# Patient Record
Sex: Male | Born: 1975 | Race: Black or African American | Hispanic: No | Marital: Single | Smoking: Current every day smoker
Health system: Southern US, Community
[De-identification: ages and names within clinical notes are randomized; demographics above are authoritative.]

## PROBLEM LIST (undated history)

## (undated) DIAGNOSIS — J45909 Unspecified asthma, uncomplicated: Secondary | ICD-10-CM

## (undated) DIAGNOSIS — R569 Unspecified convulsions: Secondary | ICD-10-CM

---

## 2011-08-07 ENCOUNTER — Encounter (HOSPITAL_COMMUNITY): Payer: Self-pay | Admitting: *Deleted

## 2011-08-07 ENCOUNTER — Emergency Department (HOSPITAL_COMMUNITY)
Admission: EM | Admit: 2011-08-07 | Discharge: 2011-08-07 | Disposition: A | Discharge status: Home / Self Care | Payer: Self-pay | Attending: Emergency Medicine | Admitting: Emergency Medicine

## 2011-08-07 DIAGNOSIS — R1110 Vomiting, unspecified: Secondary | ICD-10-CM | POA: Insufficient documentation

## 2011-08-07 DIAGNOSIS — R404 Transient alteration of awareness: Secondary | ICD-10-CM | POA: Insufficient documentation

## 2011-08-07 DIAGNOSIS — R569 Unspecified convulsions: Secondary | ICD-10-CM | POA: Insufficient documentation

## 2011-08-07 DIAGNOSIS — Z79899 Other long term (current) drug therapy: Secondary | ICD-10-CM | POA: Insufficient documentation

## 2011-08-07 HISTORY — DX: Unspecified convulsions: R56.9

## 2011-08-07 LAB — PHENOBARBITAL LEVEL: Phenobarbital: <2.4 ug/mL — ABNORMAL LOW (ref 15.0–40.0)

## 2011-08-07 MED ORDER — PHENOBARBITAL 64.8 MG PO TABS
64.8000 mg | ORAL_TABLET | ORAL | Status: AC
  Administered 2011-08-07: 64.8 mg via ORAL
  Filled 2011-08-07: qty 1

## 2011-08-07 MED ORDER — PHENYTOIN SODIUM EXTENDED 100 MG PO CAPS
300.0000 mg | ORAL_CAPSULE | Freq: Once | ORAL | Status: AC
  Administered 2011-08-07: 300 mg via ORAL
  Filled 2011-08-07: qty 3

## 2011-08-07 MED ORDER — LORAZEPAM 2 MG/ML IJ SOLN
INTRAMUSCULAR | Status: AC
  Administered 2011-08-07: 2 mg via INTRAVENOUS
  Filled 2011-08-07: qty 1

## 2011-08-07 MED ORDER — PHENYTOIN SODIUM EXTENDED 100 MG PO CAPS: 100.0000 mg | ORAL_CAPSULE | Freq: Three times a day (TID) | ORAL | Status: DC

## 2011-08-07 MED ORDER — PHENOBARBITAL 60 MG PO TABS: 60.0000 mg | ORAL_TABLET | Freq: Two times a day (BID) | ORAL | Status: DC

## 2011-08-07 NOTE — ED Notes (Addendum)
Pt reports having a petite seizure at home tonight.  Hx of seizures-not had medication in over a month.  States that he always has a grandmal seizure following a petite seizure.  Reports that he has a HA.  No bite marks on tongue, no urinary incontinence.  Pt A/O x 4.  No confusion or post-ictal noted.

## 2011-08-07 NOTE — ED Notes (Signed)
Rx x 2, pt voiced understanding to f/u with PCP and return for worsening sx.

## 2011-08-07 NOTE — ED Notes (Signed)
Pt and family requesting to see MD.  Becoming agitated, assured MD will see pt soon.

## 2011-08-07 NOTE — Discharge Instructions (Signed)
Nonepileptic Seizures  Nonepileptic seizures look like true epileptic seizures. The difference between nonepileptic seizures and real seizures is that real seizures are caused by an electrical abnormality in the brain. Nonepileptic seizures have no medical cause. Nonepileptic seizures may look real to an untrained person. A neurologist can usually tell the difference between a real seizure and a nonepileptic seizure. Nonepileptic seizures may also be called pseudoseizures. They are more frequent in women.  CAUSES   In general, the patient is unaware that the movements are not real seizures. This disorder is caused by stress or emotional trauma. Patients often feel badly. Patients are sometimes accused of causing the seizure-like movements when they are not aware that their symptoms are due to stress. The nonepileptic seizures are real and frightening to patients with this disorder. Sometimes, nonepileptic seizures may be due to a person faking the symptoms to get something he or she wants.  DIAGNOSIS   The diagnosis requires the patient to be continuously monitored by:   EEG (electroencephalogram).   Video camera.  After an episode, the patient is asked about their awareness, memory, and feelings during the seizure. The family, if present, also discusses what they see. The EEG and clinical information allows the neurologist to determine if the seizures are related to abnormal electrical activity in the brain.  TREATMENT   Medicines may be stopped if the patient has been treated for a true seizure disorder. Patient counseling is usually begun. Depression and anxiety, if present, are treated. Counseling helps to resolve stress.   Document Released: 06/04/2005 Document Revised: 04/08/2011 Document Reviewed: 10/31/2008  [employer] Patient Information 2012 [employer].

## 2011-08-07 NOTE — ED Provider Notes (Addendum)
History     CSN: [id number]  Arrival date & time 08/07/11  4098   First MD Initiated Contact with Patient 08/07/11 0440      Chief Complaint  Patient presents with  . Seizures    (Consider location/radiation/quality/duration/timing/severity/associated sxs/prior treatment) HPI This is a 36 year old black male with a history of seizures. He admits to not taking his phenobarbital and phenytoin in over a month. He had what he describes as a "small seizure" just prior to arrival. This is characterized as fluttering of his eyelashes and a blank stare. He had a single episode of emesis following this and return to his normal state. On arrival in triaged he had a generalized tonic-clonic seizure which was witnessed by nursing staff. He denies biting his tongue. He denies urinary incontinence. He states he frequently has a grand mal seizure following a petite seizure. He was given 2 mg of Ativan after seizure to prevent recurrence.  Past Medical History  Diagnosis Date  . Seizures     History reviewed. No pertinent past surgical history.  History reviewed. No pertinent family history.  History  Substance Use Topics  . Smoking status: Never Smoker   . Smokeless tobacco: Not on file  . Alcohol Use: No      Review of Systems  All other systems reviewed and are negative.    Allergies  Review of patient's allergies indicates no known allergies.  Home Medications   Current Outpatient Rx  Name Route Sig Dispense Refill  . IBUPROFEN 200 MG PO TABS Oral Take 200-400 mg by mouth every 6 (six) hours as needed. For headache or pain    . MIRTAZAPINE 7.5 MG PO TABS Oral Take 7.5 mg by mouth at bedtime.    Marland Kitchen PHENOBARBITAL 60 MG PO TABS Oral Take 60 mg by mouth at bedtime.    Marland Kitchen PHENYTOIN SODIUM EXTENDED 100 MG PO CAPS Oral Take 100 mg by mouth 3 (three) times daily.      BP 109/66  Pulse 69  Temp(Src) 98.8 F (37.1 C) (Oral)  Resp 14  SpO2 96%  Physical Exam General:  Well-developed, well-nourished male in no acute distress; appearance consistent with age of record HENT: normocephalic, atraumatic; no bite marks to tongue Eyes: pupils equal round and reactive to light; extraocular muscles intact Neck: supple Heart: regular rate and rhythm Lungs: clear to auscultation bilaterally Abdomen: soft; nondistended; nontender; no masses or hepatosplenomegaly; bowel sounds present Extremities: No deformity; full range of motion Neurologic: Awake but drowsy; motor function intact in all extremities and symmetric; no facial droop Skin: Warm and dry Psychiatric: Normal mood and affect    ED Course  Procedures (including critical care time)     MDM   Date: 08/07/2011 0217  Rate: 68  Rhythm: normal sinus rhythm  QRS Axis: normal  Intervals: normal  ST/T Wave abnormalities: normal  Conduction Disutrbances: none  Narrative Interpretation: unremarkable  Comparison with previous EKG: None available  Nursing notes and vitals signs, including pulse oximetry, reviewed.  Summary of this visit's results, reviewed by myself:  Labs:  Results for orders placed during the hospital encounter of 08/07/11  PHENOBARBITAL LEVEL      Component Value Range   Phenobarbital <2.4 (*) 15.0 - 40.0 (ug/mL)  PHENYTOIN LEVEL, TOTAL      Component Value Range   Phenytoin Lvl <2.5 (*) 10.0 - 20.0 (ug/mL)            Hanley Seamen, MD 08/07/11 0449  Xzaviar Maloof L Mackenzie Groom,  MD 08/07/11 4098  Hanley Seamen, MD 08/07/11 1191

## 2011-08-07 NOTE — ED Notes (Signed)
Pt had a witness seizure in triage lasting approx 45 seconds.  3 RNs, 2 NT with pt.  When seizure was finished, pt was lifted onto a wheelchair and taken to room 4.  Pt did have blood coming out of his mouth during the seizure.  No urinary incontinence.  Pt post-ictal after seizure.

## 2017-01-21 ENCOUNTER — Encounter: Payer: Self-pay | Admitting: *Deleted

## 2017-01-21 ENCOUNTER — Emergency Department
Admission: EM | Admit: 2017-01-21 | Discharge: 2017-01-21 | Disposition: A | Discharge status: Home / Self Care | Payer: Medicaid Other | Attending: Emergency Medicine | Admitting: Emergency Medicine

## 2017-01-21 DIAGNOSIS — R197 Diarrhea, unspecified: Secondary | ICD-10-CM | POA: Diagnosis not present

## 2017-01-21 DIAGNOSIS — R109 Unspecified abdominal pain: Secondary | ICD-10-CM | POA: Diagnosis present

## 2017-01-21 DIAGNOSIS — R1084 Generalized abdominal pain: Secondary | ICD-10-CM | POA: Diagnosis not present

## 2017-01-21 DIAGNOSIS — Z79899 Other long term (current) drug therapy: Secondary | ICD-10-CM | POA: Diagnosis not present

## 2017-01-21 DIAGNOSIS — F17210 Nicotine dependence, cigarettes, uncomplicated: Secondary | ICD-10-CM | POA: Insufficient documentation

## 2017-01-21 LAB — CBC
HEMATOCRIT: 39 % — AB (ref 40.0–52.0)
Hemoglobin: 13.3 g/dL (ref 13.0–18.0)
MCH: 31.6 pg (ref 26.0–34.0)
MCHC: 34.1 g/dL (ref 32.0–36.0)
MCV: 92.6 fL (ref 80.0–100.0)
Platelets: 227 10*3/uL (ref 150–440)
RBC: 4.21 MIL/uL — ABNORMAL LOW (ref 4.40–5.90)
RDW: 14.1 % (ref 11.5–14.5)
WBC: 3.5 10*3/uL — ABNORMAL LOW (ref 3.8–10.6)

## 2017-01-21 LAB — URINALYSIS, COMPLETE (UACMP) WITH MICROSCOPIC
BACTERIA UA: NONE SEEN
Bilirubin Urine: NEGATIVE
Glucose, UA: NEGATIVE mg/dL
HGB URINE DIPSTICK: NEGATIVE
Ketones, ur: 5 mg/dL — AB
Leukocytes, UA: NEGATIVE
NITRITE: NEGATIVE
Protein, ur: NEGATIVE mg/dL
SPECIFIC GRAVITY, URINE: 1.036 — AB (ref 1.005–1.030)
pH: 5 (ref 5.0–8.0)

## 2017-01-21 LAB — COMPREHENSIVE METABOLIC PANEL
ALBUMIN: 4 g/dL (ref 3.5–5.0)
ALK PHOS: 57 U/L (ref 38–126)
ALT: 35 U/L (ref 17–63)
AST: 36 U/L (ref 15–41)
Anion gap: 6 (ref 5–15)
BUN: 19 mg/dL (ref 6–20)
CALCIUM: 8.8 mg/dL — AB (ref 8.9–10.3)
CO2: 25 mmol/L (ref 22–32)
Chloride: 106 mmol/L (ref 101–111)
Creatinine, Ser: 0.94 mg/dL (ref 0.61–1.24)
GFR calc Af Amer: >60 mL/min (ref >60)
GFR calc non Af Amer: >60 mL/min (ref >60)
GLUCOSE: 85 mg/dL (ref 65–99)
POTASSIUM: 4 mmol/L (ref 3.5–5.1)
Sodium: 137 mmol/L (ref 135–145)
TOTAL PROTEIN: 7.4 g/dL (ref 6.5–8.1)
Total Bilirubin: 0.6 mg/dL (ref 0.3–1.2)

## 2017-01-21 LAB — LIPASE, BLOOD: Lipase: 24 U/L (ref 11–51)

## 2017-01-21 MED ORDER — DICYCLOMINE HCL 20 MG PO TABS: 20.0000 mg | ORAL_TABLET | Freq: Three times a day (TID) | ORAL | 0 refills | Status: DC | PRN

## 2017-01-21 NOTE — ED Provider Notes (Signed)
[facility] Emergency Department Provider Note  ____________________________________________   I have reviewed the triage vital signs and the nursing notes.   HISTORY  Chief Complaint Abdominal Pain   History limited by: Not Limited   HPI Larry Lucas is a 41 y.o. male who presents to the emergency department today because of concerns for abdominal pain. Is located in the lower abdomen. He describes as being located on both sides. He describes it as sharp. It does radiate towards the center of his abdomen. The pain started today. It has gotten somewhat better since it started. He did have an episode of diarrhea. He states that when he wiped he noticed a small amount of blood on the tissue. He denies any nausea or vomiting. Denies any unusual ingestion. Denies history of intestinal disorder.   Past Medical History:  Diagnosis Date  . Seizures (HCC)     There are no active problems to display for this patient.   History reviewed. No pertinent surgical history.  Prior to Admission medications   Medication Sig Start Date End Date Taking? Authorizing Provider  ibuprofen (ADVIL,MOTRIN) 200 MG tablet Take 200-400 mg by mouth every 6 (six) hours as needed. For headache or pain    [provider]  mirtazapine (REMERON) 7.5 MG tablet Take 7.5 mg by mouth at bedtime.    [provider]  PHENObarbital (LUMINAL) 60 MG tablet Take 60 mg by mouth at bedtime.    [provider]  PHENObarbital (LUMINAL) 60 MG tablet Take 1 tablet (60 mg total) by mouth 2 (two) times daily. 08/07/11 09/06/11  Molpus, John, MD  phenytoin (DILANTIN) 100 MG ER capsule Take 100 mg by mouth 3 (three) times daily.    [provider]  phenytoin (DILANTIN) 100 MG ER capsule Take 1 capsule (100 mg total) by mouth 3 (three) times daily. 08/07/11 08/06/12  Molpus, Jonny Ruiz, MD  phenytoin (DILANTIN) 100 MG ER capsule Take 1 capsule (100 mg total) by mouth 3 (three) times  daily. 08/07/11 08/06/12  Molpus, John, MD    Allergies Penicillins  No family history on file.  Social History Social History  Substance Use Topics  . Smoking status: Current Every Day Smoker    Packs/day: 0.50    Types: Cigarettes  . Smokeless tobacco: Not on file  . Alcohol use No    Review of Systems Constitutional: No fever/chills Eyes: No visual changes. ENT: No sore throat. Cardiovascular: Denies chest pain. Respiratory: Denies shortness of breath. Gastrointestinal: Positive for abdominal pain. Positive for diarrhea.  Genitourinary: Negative for dysuria. Musculoskeletal: Negative for back pain. Skin: Negative for rash. Neurological: Negative for headaches, focal weakness or numbness.  ____________________________________________   PHYSICAL EXAM:  VITAL SIGNS: ED Triage Vitals [01/21/17 1014]  Enc Vitals Group     BP 130/63     Pulse Rate 66     Resp 20     Temp 98.7 F (37.1 C)     Temp Source Oral     SpO2 100 %     Weight 215 lb (97.5 kg)     Height  (1.854 m)     Head Circumference      Peak Flow      Pain Score 8   Constitutional: Alert and oriented. Well appearing and in no distress. Eyes: Conjunctivae are normal.  ENT   Head: Normocephalic and atraumatic.   Nose: No congestion/rhinnorhea.   Mouth/Throat: Mucous membranes are moist.   Neck: No stridor. Hematological/Lymphatic/Immunilogical: No  cervical lymphadenopathy. Cardiovascular: Normal rate, regular rhythm.  No murmurs, rubs, or gallops.  Respiratory: Normal respiratory effort without tachypnea nor retractions. Breath sounds are clear and equal bilaterally. No wheezes/rales/rhonchi. Gastrointestinal: Soft and non tender. No rebound. No guarding.  Genitourinary: Deferred Musculoskeletal: Normal range of motion in all extremities. No lower extremity edema. Neurologic:  Normal speech and language. No gross focal neurologic deficits are appreciated.  Skin:  Skin is warm, dry  and intact. No rash noted. Psychiatric: Mood and affect are normal. Speech and behavior are normal. Patient exhibits appropriate insight and judgment.  ____________________________________________    LABS (pertinent positives/negatives)  CMP wnl save for ca 8.8 CBC WBC 3.5 Hgb 13.3 Pl 227 UA RBC 6-30 ____________________________________________   EKG  None  ____________________________________________    RADIOLOGY  None  ____________________________________________   PROCEDURES  Procedures  ____________________________________________   INITIAL IMPRESSION / ASSESSMENT AND PLAN / ED COURSE  Pertinent labs & imaging results that were available during my care of the patient were reviewed by me and considered in my medical decision making (see chart for details).  Patient presented with abdominal pain and diarrhea. Differential includes food poisoning, gastroenteritis, diverticulitis, crohns. Given negative blood work and benign exam think food poisoning/gastroenteritis most likely. Will plan on discharging home. Will give prescription for bentyl.   ____________________________________________   FINAL CLINICAL IMPRESSION(S) / ED DIAGNOSES  Final diagnoses:  Generalized abdominal pain  Diarrhea, unspecified type     Note: This dictation was prepared with Dragon dictation. Any transcriptional errors that result from this process are unintentional     Phineas Semen, MD 01/21/17 1354

## 2017-01-21 NOTE — ED Triage Notes (Signed)
Pt complains of lower abdominal pain below the navel starting today, pt reports one episode of rectal bleeding with bowel movement today

## 2017-01-21 NOTE — Discharge Instructions (Signed)
Please seek medical attention for any high fevers, chest pain, shortness of breath, change in behavior, persistent vomiting, bloody stool or any other new or concerning symptoms.  

## 2017-01-21 NOTE — ED Notes (Signed)
Called pt to go back to a room, pt is in the cafeteria

## 2017-01-21 NOTE — ED Notes (Signed)
Pt said he would be outside about 1055.  We could see him walking up to end of parking lot and went to his car.  He is still in his car.

## 2017-08-13 ENCOUNTER — Emergency Department: Payer: No Typology Code available for payment source

## 2017-08-13 ENCOUNTER — Other Ambulatory Visit: Payer: Self-pay

## 2017-08-13 ENCOUNTER — Emergency Department
Admission: EM | Admit: 2017-08-13 | Discharge: 2017-08-14 | Disposition: A | Discharge status: Home / Self Care | Payer: No Typology Code available for payment source | Attending: Emergency Medicine | Admitting: Emergency Medicine

## 2017-08-13 DIAGNOSIS — Y9389 Activity, other specified: Secondary | ICD-10-CM | POA: Diagnosis not present

## 2017-08-13 DIAGNOSIS — S4982XA Other specified injuries of left shoulder and upper arm, initial encounter: Secondary | ICD-10-CM | POA: Diagnosis present

## 2017-08-13 DIAGNOSIS — S40012A Contusion of left shoulder, initial encounter: Secondary | ICD-10-CM | POA: Insufficient documentation

## 2017-08-13 DIAGNOSIS — S46002A Unspecified injury of muscle(s) and tendon(s) of the rotator cuff of left shoulder, initial encounter: Secondary | ICD-10-CM | POA: Diagnosis not present

## 2017-08-13 DIAGNOSIS — W01198A Fall on same level from slipping, tripping and stumbling with subsequent striking against other object, initial encounter: Secondary | ICD-10-CM | POA: Insufficient documentation

## 2017-08-13 DIAGNOSIS — Y929 Unspecified place or not applicable: Secondary | ICD-10-CM | POA: Insufficient documentation

## 2017-08-13 DIAGNOSIS — Y990 Civilian activity done for income or pay: Secondary | ICD-10-CM | POA: Diagnosis not present

## 2017-08-13 DIAGNOSIS — F17210 Nicotine dependence, cigarettes, uncomplicated: Secondary | ICD-10-CM | POA: Diagnosis not present

## 2017-08-13 DIAGNOSIS — T148XXA Other injury of unspecified body region, initial encounter: Secondary | ICD-10-CM

## 2017-08-13 DIAGNOSIS — Z79899 Other long term (current) drug therapy: Secondary | ICD-10-CM | POA: Insufficient documentation

## 2017-08-13 IMAGING — CR DG SHOULDER 2+V*L*
1 series · 3 of 3 positions shown · non-contrast
Comparison: None.

CLINICAL DATA: Fall, left shoulder pain

EXAM:
LEFT SHOULDER - 2+ VIEW

[Series 1: dg shoulder left · 0.14mm/px · 3 of 3 slices shown]
[im 1/3]
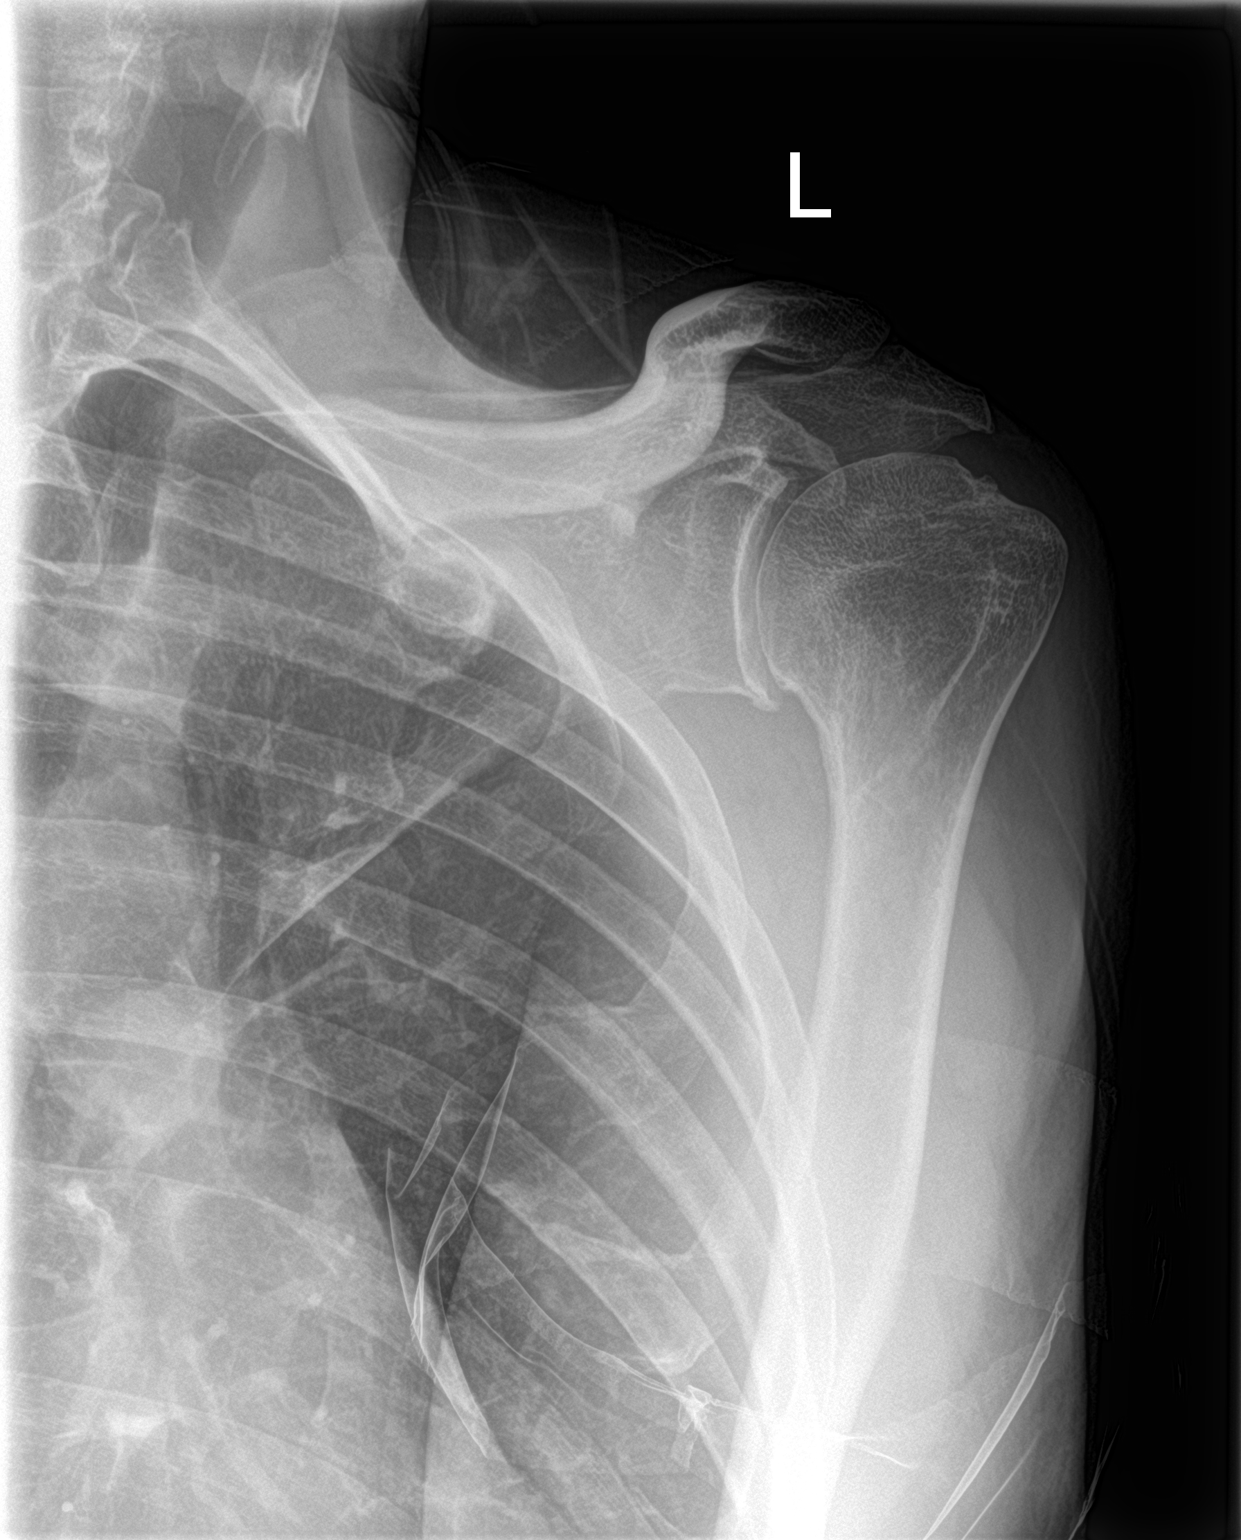
[im 2/3]
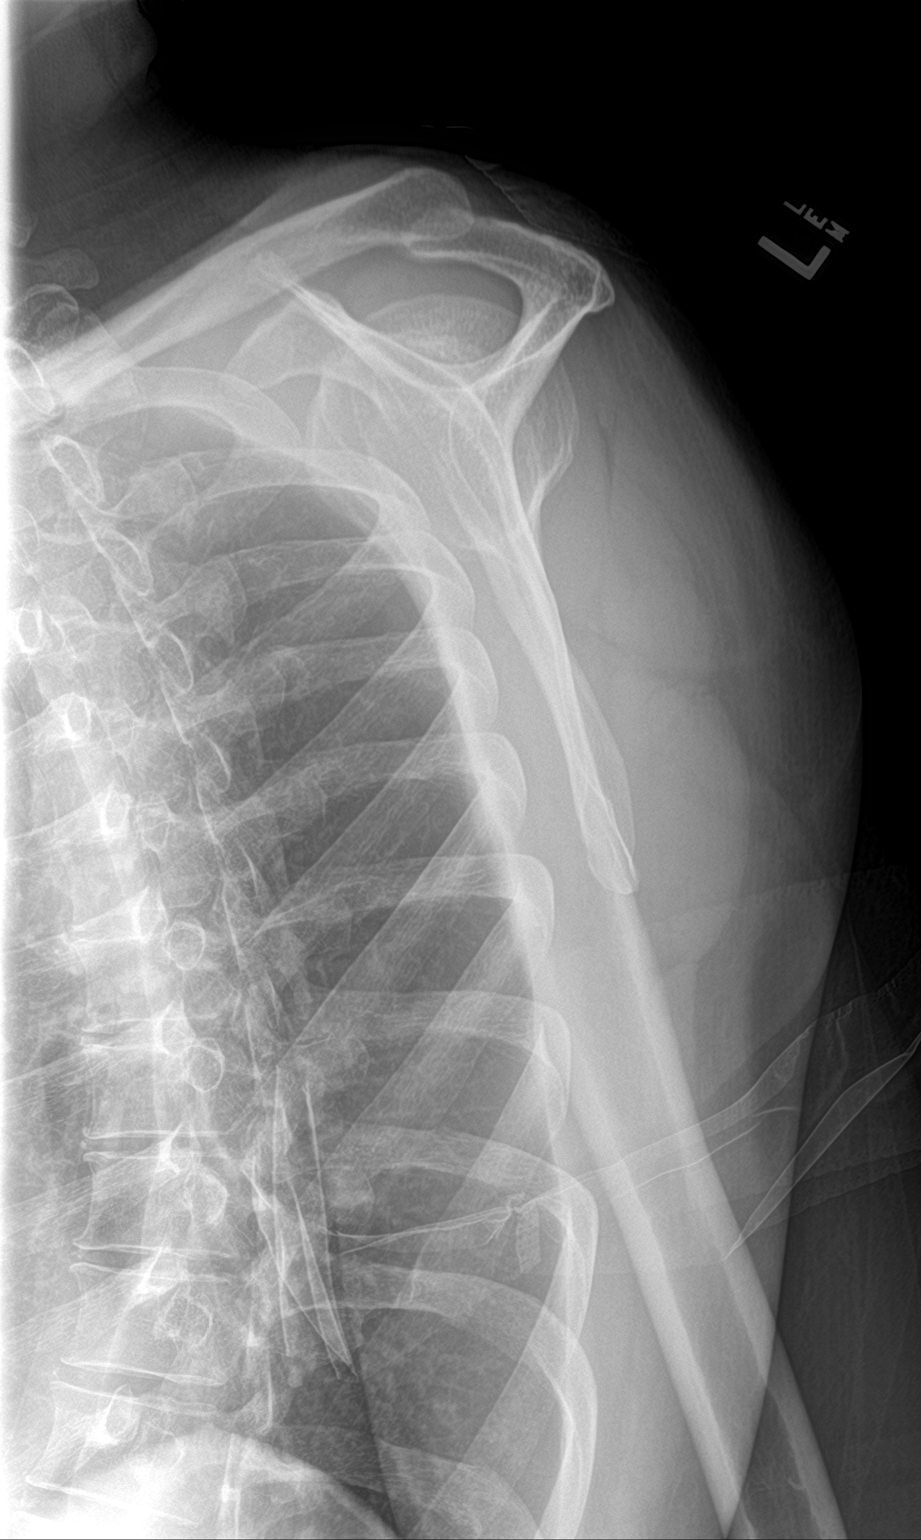
[im 3/3]
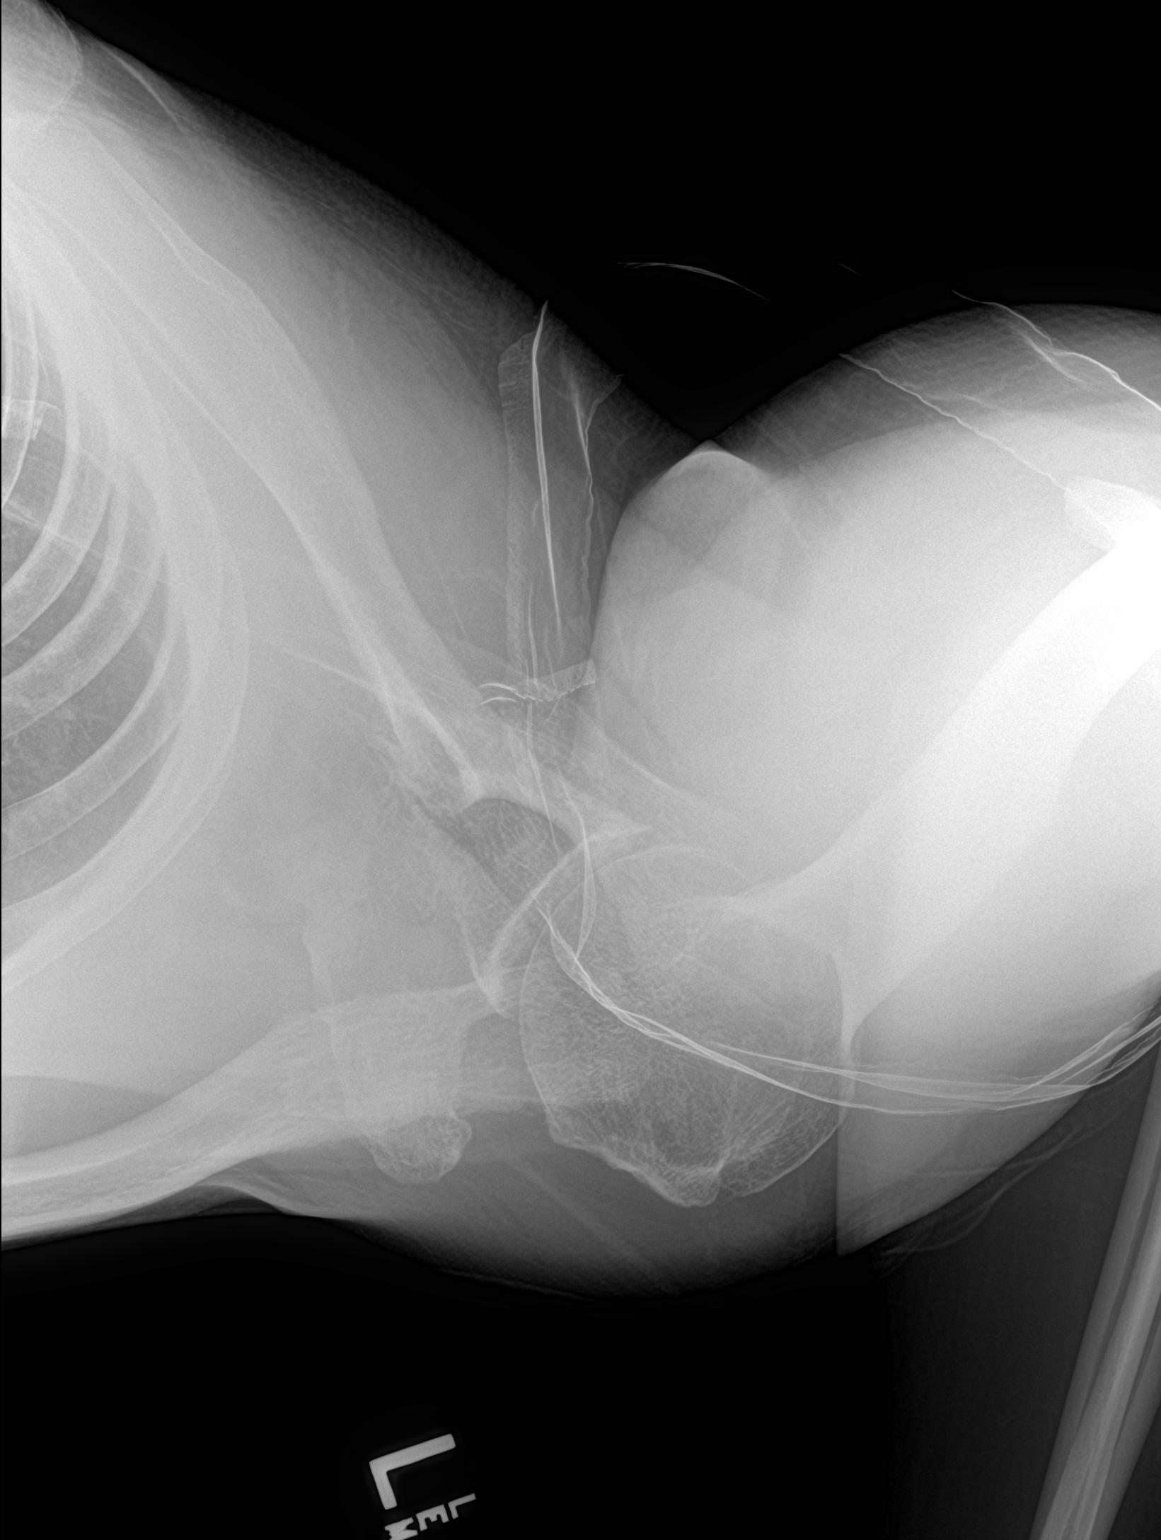

[3 of 3 positions shown; findings below may reference images not displayed]

FINDINGS: No fracture or dislocation is seen.

Mild degenerative changes of the glenohumeral joint.

The visualized soft tissues are unremarkable.

Visualized left lung is clear.
IMPRESSION: No fracture or dislocation is seen.

## 2017-08-13 NOTE — ED Notes (Addendum)
Unable to speak with someone regarding w/c requirements. No answer by contact person Serita KyleEdwina Collins on w/c  profile at either number provided [phone]) or [phone]).

## 2017-08-13 NOTE — ED Triage Notes (Signed)
Pt arrives to ED via POV from work with c/o slip and fall injury. Pt reports slipping in some detergent on the floor at work and injured his left shoulder. No reports of head injury or LOC. No obvious deformity or dislocation observed; CMS intact. Pt reports injury will be a Worker's Comp case; profile is being looked up at this time for UDS/BAL requirements.

## 2017-08-13 NOTE — ED Notes (Signed)
First nurse note: pt states he slipped in liquid at his job, falling injuring left shoulder.

## 2017-08-14 MED ORDER — CARISOPRODOL 350 MG PO TABS: 350.0000 mg | ORAL_TABLET | Freq: Three times a day (TID) | ORAL | 0 refills | Status: DC | PRN

## 2017-08-14 MED ORDER — IBUPROFEN 600 MG PO TABS
600.0000 mg | ORAL_TABLET | Freq: Once | ORAL | Status: AC
  Administered 2017-08-14: 600 mg via ORAL
  Filled 2017-08-14: qty 1

## 2017-08-14 NOTE — ED Provider Notes (Signed)
[facility] Emergency Department Provider Note  ___________________________________________   First MD Initiated Contact with Patient 08/13/17 2352     (approximate)  I have reviewed the triage vital signs and the nursing notes.   HISTORY  Chief Complaint Fall  HPI Larry Lucas is a 42 y.o. male with a history of seizure disorder who is presenting after a fall at his place of work this evening.  He states that he slipped on some detergent on the floor and fell, hitting his left collarbone into a metal shelf.  He says that he tensed up during the fall and now he is having pain to his left shoulder blade as well.  He has pain as well as a left collarbone, shoulder as well as left thoracic back when he moves his left arm at this time.  Is not reporting any loss of consciousness or hitting his head.  He is here on a Workmen's Comp. claim.  Says that he also requires heavy lifting in his job up to 50 pounds at a time.   Past Medical History:  Diagnosis Date  . Seizures (HCC)     There are no active problems to display for this patient.   History reviewed. No pertinent surgical history.  Prior to Admission medications   Medication Sig Start Date End Date Taking? Authorizing Provider  dicyclomine (BENTYL) 20 MG tablet Take 1 tablet (20 mg total) by mouth 3 (three) times daily as needed (abdominal pain). 01/21/17   Phineas Semen, MD  PHENObarbital (LUMINAL) 60 MG tablet Take 1 tablet (60 mg total) by mouth 2 (two) times daily. 08/07/11 09/06/11  Molpus, Jonny Ruiz, MD  phenytoin (DILANTIN) 100 MG ER capsule Take 1 capsule (100 mg total) by mouth 3 (three) times daily. 08/07/11 08/06/12  Molpus, Jonny Ruiz, MD  phenytoin (DILANTIN) 100 MG ER capsule Take 1 capsule (100 mg total) by mouth 3 (three) times daily. 08/07/11 08/06/12  Molpus, John, MD    Allergies Penicillins  No family history on file.  Social History Social History   Tobacco Use  . Smoking status: Current  Every Day Smoker    Packs/day: 0.50    Types: Cigarettes  . Smokeless tobacco: Never Used  Substance Use Topics  . Alcohol use: No  . Drug use: No    Review of Systems  Constitutional: No fever/chills Eyes: No visual changes. ENT: No sore throat. Cardiovascular: Denies chest pain. Respiratory: Denies shortness of breath. Gastrointestinal: No abdominal pain.  No nausea, no vomiting.  No diarrhea.  No constipation. Genitourinary: Negative for dysuria. Musculoskeletal: As above Skin: Negative for rash. Neurological: Negative for headaches, focal weakness or numbness.   ____________________________________________   PHYSICAL EXAM:  VITAL SIGNS: ED Triage Vitals  Enc Vitals Group     BP 08/13/17 2228 122/71     Pulse Rate 08/13/17 2228 71     Resp 08/13/17 2228 18     Temp 08/13/17 2228 98.5 F (36.9 C)     Temp Source 08/13/17 2228 Oral     SpO2 08/13/17 2228 95 %     Weight 08/13/17 2222 215 lb (97.5 kg)     Height 08/13/17 2222 6\' 1"  (1.854 m)     Head Circumference --      Peak Flow --      Pain Score 08/13/17 2222 8     Pain Loc --      Pain Edu? --      Excl. in GC? --  Constitutional: Alert and oriented. Well appearing and in no acute distress. Eyes: Conjunctivae are normal.  Head: Atraumatic. Nose: No congestion/rhinnorhea. Mouth/Throat: Mucous membranes are moist.  Neck: No stridor.   Cardiovascular: Normal rate, regular rhythm. Grossly normal heart sounds.  Good peripheral circulation with left-sided radial pulse which is intact. Respiratory: Normal respiratory effort.  No retractions. Lungs CTAB. Gastrointestinal: Soft and nontender. No distention. No CVA tenderness. Musculoskeletal: No lower extremity tenderness nor edema.  No joint effusions.  Tenderness to palpation of the left supraspinatus.  Mild tenderness over the spine of the scapula without any deformity, bruising or crepitus.  Patient able to AB duct his left arm to about 40 degrees but  with pain.  He is also diffusely tender to the left shoulder but without any deformity, no effusion, no bruising.  Distal to the left shoulder the patient is neurovascularly intact.  Also with tenderness to palpation along the distribution of the left clavicle especially to the middle as well as distal third without any deformity.  On exam there does not appear to be an AC separation.  Neurologic:  Normal speech and language. No gross focal neurologic deficits are appreciated. Skin:  Skin is warm, dry and intact. No rash noted. Psychiatric: Mood and affect are normal. Speech and behavior are normal.  ____________________________________________   LABS (all labs ordered are listed, but only abnormal results are displayed)  Labs Reviewed - No data to display ____________________________________________  EKG   ____________________________________________  RADIOLOGY   ____________________________________________   PROCEDURES  Procedure(s) performed:   Procedures  Critical Care performed:   ____________________________________________   INITIAL IMPRESSION / ASSESSMENT AND PLAN / ED COURSE  Pertinent labs & imaging results that were available during my care of the patient were reviewed by me and considered in my medical decision making (see chart for details).  DDX: Clavicular contusion, AC separation, shoulder fracture, humerus fracture, scapular fracture, rotator cuff strain, rotator cuff tear As part of my medical decision making, I reviewed the following data within the electronic MEDICAL RECORD NUMBER Notes from prior ED visits  Patient without any evidence of severe injury but still very uncomfortable.  Was given ibuprofen in the emergency department.  I recommended ibuprofen at home as well as ice.  He will also be discharged with a muscle relaxer.  He will be given 2 days off because of his injury and he will follow-up with a Workmen's Comp. referral from his office.  We  discussed possible MRI at a later date if he does not have improved pain especially to his back over the musculature.  He is understanding of this plan and willing to comply. ____________________________________________   FINAL CLINICAL IMPRESSION(S) / ED DIAGNOSES  Contusion to the left clavicle.  Rotator cuff injury.    NEW MEDICATIONS STARTED DURING THIS VISIT:  New Prescriptions   No medications on file     Note:  This document was prepared using Dragon voice recognition software and may include unintentional dictation errors.     Myrna BlazerSchaevitz, David Matthew, MD 08/14/17 [id number]

## 2020-03-21 ENCOUNTER — Other Ambulatory Visit: Payer: Self-pay

## 2020-03-21 ENCOUNTER — Emergency Department (HOSPITAL_COMMUNITY)
Admission: EM | Admit: 2020-03-21 | Discharge: 2020-03-22 | Disposition: A | Discharge status: Home / Self Care | Payer: BC Managed Care – PPO | Attending: Emergency Medicine | Admitting: Emergency Medicine

## 2020-03-21 DIAGNOSIS — S93402A Sprain of unspecified ligament of left ankle, initial encounter: Secondary | ICD-10-CM

## 2020-03-21 DIAGNOSIS — F17210 Nicotine dependence, cigarettes, uncomplicated: Secondary | ICD-10-CM | POA: Insufficient documentation

## 2020-03-21 DIAGNOSIS — Z7901 Long term (current) use of anticoagulants: Secondary | ICD-10-CM | POA: Insufficient documentation

## 2020-03-21 DIAGNOSIS — X501XXA Overexertion from prolonged static or awkward postures, initial encounter: Secondary | ICD-10-CM | POA: Insufficient documentation

## 2020-03-21 DIAGNOSIS — S99922A Unspecified injury of left foot, initial encounter: Secondary | ICD-10-CM | POA: Diagnosis present

## 2020-03-21 DIAGNOSIS — R52 Pain, unspecified: Secondary | ICD-10-CM

## 2020-03-22 ENCOUNTER — Emergency Department (HOSPITAL_COMMUNITY): Payer: BC Managed Care – PPO

## 2020-03-22 ENCOUNTER — Other Ambulatory Visit: Payer: Self-pay

## 2020-03-22 MED ORDER — IBUPROFEN 400 MG PO TABS
600.0000 mg | ORAL_TABLET | Freq: Once | ORAL | Status: AC
  Administered 2020-03-22: 600 mg via ORAL
  Filled 2020-03-22: qty 1

## 2020-03-22 MED ORDER — OXYCODONE-ACETAMINOPHEN 5-325 MG PO TABS
1.0000 | ORAL_TABLET | ORAL | Status: DC | PRN
  Administered 2020-03-22: 1 via ORAL
  Filled 2020-03-22: qty 1

## 2020-03-22 MED ORDER — IBUPROFEN 400 MG PO TABS: 400.0000 mg | ORAL_TABLET | Freq: Four times a day (QID) | ORAL | 0 refills | Status: DC | PRN

## 2020-03-22 NOTE — ED Notes (Signed)
Pt taken to XRAY

## 2020-03-22 NOTE — Progress Notes (Signed)
Orthopedic Tech Progress Note Patient Details:  Larry Lucas 11-14-1975 [id number]  Ortho Devices Type of Ortho Device: CAM walker Ortho Device/Splint Location: Left Lower Extremity Ortho Device/Splint Interventions: Ordered, Application   Post Interventions Patient Tolerated: Well Instructions Provided: Adjustment of device, Care of device, Poper ambulation with device   Kee Drudge P Harle Stanford 03/22/2020, 10:34 AM

## 2020-03-22 NOTE — ED Provider Notes (Signed)
[facility] EMERGENCY DEPARTMENT Provider Note   CSN: [id number] Arrival date & time: 03/21/20  2317     History Chief Complaint  Patient presents with  . Foot Pain    Larry Lucas is a 44 y.o. male.  HPI 44 year old male presents with left foot pain.  He injured it a couple weeks ago when he stepped out of a car and twisted his foot.  He has been having pain ever since but it has been progressively worsening. No leg swelling. Pain is primarily in his heel when he walks.  No fevers.  Pain is slightly worsening since onset.   Past Medical History:  Diagnosis Date  . Seizures (HCC)     There are no problems to display for this patient.   No past surgical history on file.     No family history on file.  Social History   Tobacco Use  . Smoking status: Current Every Day Smoker    Packs/day: 0.50    Types: Cigarettes  . Smokeless tobacco: Never Used  Substance Use Topics  . Alcohol use: No  . Drug use: No    Home Medications Prior to Admission medications   Medication Sig Start Date End Date Taking? Authorizing Provider  brimonidine (ALPHAGAN P) 0.1 % SOLN Place 1 drop into both eyes daily.   Yes [provider]  furosemide (LASIX) 40 MG tablet Take 40 mg by mouth daily. 11/16/19  Yes [provider]  hydrOXYzine (VISTARIL) 25 MG capsule Take 25 mg by mouth daily. 11/16/19  Yes [provider]  Monte Fantasia INHUB 100-50 MCG/DOSE AEPB Inhale 1 puff into the lungs in the morning, at noon, and at bedtime. 11/22/19  Yes [provider]  carisoprodol (SOMA) 350 MG tablet Take 1 tablet (350 mg total) by mouth 3 (three) times daily as needed. Patient not taking: Reported on 03/22/2020 08/14/17   Myrna Blazer, MD  dicyclomine (BENTYL) 20 MG tablet Take 1 tablet (20 mg total) by mouth 3 (three) times daily as needed (abdominal pain). Patient not taking: Reported on 03/22/2020 01/21/17   Phineas Semen, MD   ibuprofen (ADVIL) 400 MG tablet Take 1 tablet (400 mg total) by mouth every 6 (six) hours as needed. 03/22/20   Pricilla Loveless, MD  PHENObarbital (LUMINAL) 60 MG tablet Take 1 tablet (60 mg total) by mouth 2 (two) times daily. Patient not taking: Reported on 03/22/2020 08/07/11 09/06/11  Molpus, Jonny Ruiz, MD  phenytoin (DILANTIN) 100 MG ER capsule Take 1 capsule (100 mg total) by mouth 3 (three) times daily. Patient not taking: Reported on 03/22/2020 08/07/11 08/06/12  Molpus, Jonny Ruiz, MD  phenytoin (DILANTIN) 100 MG ER capsule Take 1 capsule (100 mg total) by mouth 3 (three) times daily. Patient not taking: Reported on 03/22/2020 08/07/11 08/06/12  Molpus, Jonny Ruiz, MD    Allergies    Penicillins  Review of Systems   Review of Systems  Constitutional: Negative for fever.  Musculoskeletal: Positive for arthralgias and joint swelling.    Physical Exam Updated Vital Signs BP 114/80   Pulse 75   Temp 97.7 F (36.5 C) (Oral)   Resp 14   SpO2 100%   Physical Exam Vitals and nursing note reviewed.  Constitutional:      Appearance: He is well-developed. He is obese.  HENT:     Head: Normocephalic and atraumatic.     Right Ear: External ear normal.     Left Ear: External ear normal.     Nose:  Nose normal.  Eyes:     General:        Right eye: No discharge.        Left eye: No discharge.  Cardiovascular:     Rate and Rhythm: Normal rate and regular rhythm.     Pulses:          Dorsalis pedis pulses are detected w/ Doppler on the left side.  Pulmonary:     Effort: Pulmonary effort is normal.  Abdominal:     General: There is no distension.  Musculoskeletal:     Cervical back: Neck supple.     Left lower leg: No swelling or tenderness. No edema.     Left ankle: Swelling present. Normal range of motion.     Left Achilles Tendon: No tenderness or defects.     Left foot: Swelling (proximal) and tenderness (proximal) present.     Comments: No erythema or increased warmth to left ankle  Skin:     General: Skin is warm and dry.  Neurological:     Mental Status: He is alert.  Psychiatric:        Mood and Affect: Mood is not anxious.     ED Results / Procedures / Treatments   Labs (all labs ordered are listed, but only abnormal results are displayed) Labs Reviewed - No data to display  EKG None  Radiology DG Ankle Complete Left  Result Date: 03/22/2020 CLINICAL DATA:  Continued acute LEFT ankle pain and swelling following injury 2 weeks ago. Initial encounter. EXAM: LEFT ANKLE COMPLETE - 3+ VIEW COMPARISON:  None. FINDINGS: No acute fracture, subluxation or dislocation. Diffuse soft tissue swelling is noted. No focal bony lesions are identified. IMPRESSION: Soft tissue swelling without acute bony abnormality. Electronically Signed   By: Harmon Pier M.D.   On: 03/22/2020 08:46   DG Foot Complete Left  Result Date: 03/22/2020 CLINICAL DATA:  Possible twisting injury, pain for 2 weeks EXAM: LEFT FOOT - COMPLETE 3+ VIEW COMPARISON:  None. FINDINGS: No acute or healing fracture or other osseous injury is identified. Alignment is grossly maintained within limitations of a non weight-bearing exam. Diffuse interphalangeal arthrosis is present with more mild degenerative changes in the mid and hindfoot. Bidirectional calcaneal spurs are present. There is a trace ankle joint effusion without visible ankle fracture on these nondedicated views. IMPRESSION: 1. Ankle joint effusion and swelling. Consider dedicated ankle views. 2. No acute or healing fracture or other osseous injury of the foot. 3. Degenerative changes throughout the interphalangeal joints and more mild changes in the mid and hindfoot. 4. Bidirectional calcaneal spurs. Electronically Signed   By: Kreg Shropshire M.D.   On: 03/22/2020 01:21    Procedures Procedures (including critical care time)  Medications Ordered in ED Medications  oxyCODONE-acetaminophen (PERCOCET/ROXICET) 5-325 MG per tablet 1 tablet (1 tablet Oral Given  03/22/20 0013)  ibuprofen (ADVIL) tablet 600 mg (600 mg Oral Given 03/22/20 1308)    ED Course  I have reviewed the triage vital signs and the nursing notes.  Pertinent labs & imaging results that were available during my care of the patient were reviewed by me and considered in my medical decision making (see chart for details).    MDM Rules/Calculators/A&P                          Patient's x-rays have been reviewed by myself.  No obvious fracture or dislocation.  Some swelling, perhaps small joint effusion.  Overall, suspicious this is probably a ankle sprain.  Unclear why his heel is hurting given negative x-rays after a couple weeks of symptoms.  Doubt occult fracture.  At this point, I have very low suspicion that this is an infectious joint and I think he can be discharged with RICE and a cam walker for support.  Follow-up with orthopedics given length of time of symptoms. Final Clinical Impression(s) / ED Diagnoses Final diagnoses:  Pain  Sprain of left ankle, unspecified ligament, initial encounter    Rx / DC Orders ED Discharge Orders         Ordered    ibuprofen (ADVIL) 400 MG tablet  Every 6 hours PRN        03/22/20 0906           Pricilla Loveless, MD 03/22/20 [phone]

## 2020-03-22 NOTE — ED Triage Notes (Signed)
Pt presents to ED POv. Pt c/o L foot pain. Pain began x2w, pt states that he thought he twisted foot when pain began. Pt reports that pain is 10/10 when he steps on it.

## 2020-03-31 ENCOUNTER — Ambulatory Visit (INDEPENDENT_AMBULATORY_CARE_PROVIDER_SITE_OTHER): Payer: BC Managed Care – PPO | Admitting: Orthopaedic Surgery

## 2020-03-31 ENCOUNTER — Encounter: Payer: Self-pay | Admitting: Orthopaedic Surgery

## 2020-03-31 DIAGNOSIS — S93492D Sprain of other ligament of left ankle, subsequent encounter: Secondary | ICD-10-CM

## 2020-03-31 NOTE — Progress Notes (Signed)
Office Visit Note   Patient: Larry Lucas           Date of Birth: [birth year]           MRN: [id number] Visit Date: 03/31/2020              Requested by: [facility] [address] [place],  [place] [address] PCP: [facility]   Assessment & Plan: Visit Diagnoses:  1. Sprain of anterior talofibular ligament of left ankle, subsequent encounter     Plan: I agree with him wearing the walking boot for least another 2 weeks.  We will keep him out of work during that period of time and reevaluate him in 2 weeks from now.  No x-rays are needed.  Hopefully we can transition him to an ASO at that visit.  Follow-Up Instructions: Return in about 2 weeks (around 04/14/2020).   Orders:  No orders of the defined types were placed in this encounter.  No orders of the defined types were placed in this encounter.     Procedures: No procedures performed   Clinical Data: No additional findings.   Subjective: Chief Complaint  Patient presents with  . Left Ankle - Pain, Injury  The patient is a 44 year old gentleman who is over 2 weeks out from a significant left ankle sprain.  This actually occurred just over 2 weeks ago and it was a week later when he went to the emergency room and had x-rays of his left foot and left ankle performed.  These were negative for fracture.  Due to soft tissue swelling he was placed appropriately in a cam walking boot.  He has been weightbearing as tolerated in cam walking boot.  He is not allowed to go back to work yet in the boot and his work does acquire him standing and walking quite a bit as well as lifting frequently greater than 50 pounds.  He still reports significant left ankle pain and swelling.  He reports that he is more comfortable in the walking boot  HPI  Review of Systems He currently denies any headache, chest pain, shortness of breath, fever, chills, nausea, vomiting  Objective: Vital  Signs: There were no vitals taken for this visit.  Physical Exam He is alert and orient x3 and in no acute distress Ortho Exam Examination of his left foot and left ankle shows that there ligaments is stable with full range of motion but globally swelling.  Most of his pain is over the anterior talofibular ligament.  His Achilles is intact and his foot is neurovascularly intact. Specialty Comments:  No specialty comments available.  Imaging: No results found. X-rays independently reviewed of his left foot and left ankle show no obvious fracture and only soft tissue swelling.  There is no dislocation.  There is otherwise no acute or chronic findings.  PMFS History: There are no problems to display for this patient.  Past Medical History:  Diagnosis Date  . Seizures (HCC)     History reviewed. No pertinent family history.  History reviewed. No pertinent surgical history. Social History   Occupational History  . Not on file  Tobacco Use  . Smoking status: Current Every Day Smoker    Packs/day: 0.50    Types: Cigarettes  . Smokeless tobacco: Never Used  Substance and Sexual Activity  . Alcohol use: No  . Drug use: No  . Sexual activity: Not on file

## 2020-04-02 ENCOUNTER — Telehealth: Payer: Self-pay | Admitting: Orthopaedic Surgery

## 2020-04-02 NOTE — Telephone Encounter (Signed)
Patient submitted medical release form, [employer] short term disability forms, and payment of $25.00 cash. Accepted 04/02/2020

## 2020-04-14 ENCOUNTER — Ambulatory Visit (INDEPENDENT_AMBULATORY_CARE_PROVIDER_SITE_OTHER): Payer: PRIVATE HEALTH INSURANCE | Admitting: Orthopaedic Surgery

## 2020-04-14 ENCOUNTER — Encounter: Payer: Self-pay | Admitting: Orthopaedic Surgery

## 2020-04-14 DIAGNOSIS — S93492D Sprain of other ligament of left ankle, subsequent encounter: Secondary | ICD-10-CM

## 2020-04-14 NOTE — Progress Notes (Signed)
The patient is in follow-up after having a severe left ankle sprain.  He has been in a cam walking boot.  He reports that he is doing much better overall and has less ankle swelling.  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  He is requesting a return to work note as well.  Examination of his left ankle shows much improved range of motion.  There is minimal swelling and bruising.  His left ankle is ligamentously stable and neurovascularly intact.  We will transition him to an ASO that he can wear his regular shoes.  He should wear this at work as he is continuing to heal this ankle sprain.  All questions and concerns were answered and addressed.  He can come out of the ASO as comfort allows.

## 2020-09-03 ENCOUNTER — Emergency Department
Admission: EM | Admit: 2020-09-03 | Discharge: 2020-09-03 | Disposition: A | Discharge status: Home / Self Care | Payer: BC Managed Care – PPO | Attending: Student in an Organized Health Care Education/Training Program | Admitting: Student in an Organized Health Care Education/Training Program

## 2020-09-03 ENCOUNTER — Encounter: Payer: Self-pay | Admitting: Emergency Medicine

## 2020-09-03 ENCOUNTER — Other Ambulatory Visit: Payer: Self-pay

## 2020-09-03 DIAGNOSIS — F17210 Nicotine dependence, cigarettes, uncomplicated: Secondary | ICD-10-CM | POA: Insufficient documentation

## 2020-09-03 DIAGNOSIS — L249 Irritant contact dermatitis, unspecified cause: Secondary | ICD-10-CM | POA: Diagnosis not present

## 2020-09-03 DIAGNOSIS — Z77098 Contact with and (suspected) exposure to other hazardous, chiefly nonmedicinal, chemicals: Secondary | ICD-10-CM | POA: Insufficient documentation

## 2020-09-03 DIAGNOSIS — R234 Changes in skin texture: Secondary | ICD-10-CM | POA: Diagnosis present

## 2020-09-03 NOTE — ED Notes (Signed)
Pt assessed and injury cleaned by provider prior to discharge.

## 2020-09-03 NOTE — ED Provider Notes (Signed)
[facility]-EMERGENCY DEPARTMENT  ____________________________________________  Time seen: Approximately 3:54 PM  I have reviewed the triage vital signs and the nursing notes.   HISTORY  Chief Complaint Chemical Exposure   Historian Patient    HPI Larry Lucas is a 45 y.o. male presents to the emergency department with facial flaking and mild erythema after patient states that he was tested with spices from a work accident.  Patient states that he works for [employer] and spices flew in his face while working.  He denies changes in vision or increased tearing although he states that his eyelids feel somewhat itchy.  He states that he has never had a similar reaction in the past.  No increased work of breathing, cough, chest tightness or chest pain.  He states that he splashed his face with water and noticed increased burning.  He states that there are multiple components within the spice collection.  He had does have a known peanut allergy but states that spaces are avoided peanuts.   Past Medical History:  Diagnosis Date  . Seizures (HCC)      Immunizations up to date:  Yes.     Past Medical History:  Diagnosis Date  . Seizures (HCC)     There are no problems to display for this patient.   History reviewed. No pertinent surgical history.  Prior to Admission medications   Medication Sig Start Date End Date Taking? Authorizing Provider  brimonidine (ALPHAGAN P) 0.1 % SOLN Place 1 drop into both eyes daily.    [provider]  carisoprodol (SOMA) 350 MG tablet Take 1 tablet (350 mg total) by mouth 3 (three) times daily as needed. Patient not taking: Reported on 03/22/2020 08/14/17   Myrna Blazer, MD  dicyclomine (BENTYL) 20 MG tablet Take 1 tablet (20 mg total) by mouth 3 (three) times daily as needed (abdominal pain). Patient not taking: Reported on 03/22/2020 01/21/17   Phineas Semen, MD  furosemide (LASIX) 40 MG tablet Take 40 mg  by mouth daily. 11/16/19   [provider]  hydrOXYzine (VISTARIL) 25 MG capsule Take 25 mg by mouth daily. 11/16/19   [provider]  ibuprofen (ADVIL) 400 MG tablet Take 1 tablet (400 mg total) by mouth every 6 (six) hours as needed. 03/22/20   Pricilla Loveless, MD  PHENObarbital (LUMINAL) 60 MG tablet Take 1 tablet (60 mg total) by mouth 2 (two) times daily. Patient not taking: Reported on 03/22/2020 08/07/11 09/06/11  Molpus, Jonny Ruiz, MD  phenytoin (DILANTIN) 100 MG ER capsule Take 1 capsule (100 mg total) by mouth 3 (three) times daily. Patient not taking: Reported on 03/22/2020 08/07/11 08/06/12  Molpus, Jonny Ruiz, MD  phenytoin (DILANTIN) 100 MG ER capsule Take 1 capsule (100 mg total) by mouth 3 (three) times daily. Patient not taking: Reported on 03/22/2020 08/07/11 08/06/12  Molpus, Jonny Ruiz, MD  Monte Fantasia INHUB 100-50 MCG/DOSE AEPB Inhale 1 puff into the lungs in the morning, at noon, and at bedtime. 11/22/19   [provider]    Allergies Penicillins  No family history on file.  Social History Social History   Tobacco Use  . Smoking status: Current Every Day Smoker    Packs/day: 0.50    Types: Cigarettes  . Smokeless tobacco: Never Used  Substance Use Topics  . Alcohol use: No  . Drug use: No     Review of Systems  Constitutional: No fever/chills Eyes:  No discharge ENT: No upper respiratory complaints. Respiratory: no cough. No SOB/  use of accessory muscles to breath Gastrointestinal:   No nausea, no vomiting.  No diarrhea.  No constipation. Musculoskeletal: Negative for musculoskeletal pain. Skin: Patient has facial flaking and erythema.     ____________________________________________   PHYSICAL EXAM:  VITAL SIGNS: ED Triage Vitals  Enc Vitals Group     BP 09/03/20 1425 110/64     Pulse Rate 09/03/20 1425 88     Resp 09/03/20 1425 18     Temp 09/03/20 1425 98.6 F (37 C)     Temp Source 09/03/20 1425 Oral     SpO2 09/03/20 1425 96 %     Weight  09/03/20 1419 214 lb 15.2 oz (97.5 kg)     Height 09/03/20 1419 6\' 1"  (1.854 m)     Head Circumference --      Peak Flow --      Pain Score 09/03/20 1419 3     Pain Loc --      Pain Edu? --      Excl. in GC? --      Constitutional: Alert and oriented. Well appearing and in no acute distress. Eyes: Conjunctivae are normal. PERRL. EOMI. Head: Atraumatic. ENT:      Nose: No congestion/rhinnorhea.      Mouth/Throat: Mucous membranes are moist.  Neck: No stridor.  No cervical spine tenderness to palpation. Cardiovascular: Normal rate, regular rhythm. Normal S1 and S2.  Good peripheral circulation. Respiratory: Normal respiratory effort without tachypnea or retractions. Lungs CTAB. Good air entry to the bases with no decreased or absent breath sounds Gastrointestinal: Bowel sounds x 4 quadrants. Soft and nontender to palpation. No guarding or rigidity. No distention. Musculoskeletal: Full range of motion to all extremities. No obvious deformities noted Neurologic:  Normal for age. No gross focal neurologic deficits are appreciated.  Skin: Patient has mild erythema of the upper face with associated flaking.  No vesicle formation. Psychiatric: Mood and affect are normal for age. Speech and behavior are normal.   ____________________________________________   LABS (all labs ordered are listed, but only abnormal results are displayed)  Labs Reviewed - No data to display ____________________________________________  EKG   ____________________________________________  RADIOLOGY  No results found.  ____________________________________________    PROCEDURES  Procedure(s) performed:     Procedures     Medications - No data to display   ____________________________________________   INITIAL IMPRESSION / ASSESSMENT AND PLAN / ED COURSE  Pertinent labs & imaging results that were available during my care of the patient were reviewed by me and considered in my medical  decision making (see chart for details).    Assessment and Plan:   Irritant contact dermatitis 45 year old male presents to the emergency department with signs and symptoms of irritant contact dermatitis.  Patient's face was irrigated with normal saline while in the emergency department and I applied Vaseline.  Recommended application of Vaseline 2-3 times daily over the next week and avoiding excessive sunlight exposure.  We will hold off on corticosteroid use at this time as reaction seems mild in nature.  Return precautions were given to return with new or worsening symptoms.     ____________________________________________  FINAL CLINICAL IMPRESSION(S) / ED DIAGNOSES  Final diagnoses:  Irritant dermatitis      NEW MEDICATIONS STARTED DURING THIS VISIT:  ED Discharge Orders    None          This chart was dictated using voice recognition software/Dragon. Despite best efforts to proofread, errors can occur which can change the  meaning. Any change was purely unintentional.     Orvil Feil, PA-C 09/03/20 1557    Willy Eddy, MD 09/03/20 2008

## 2020-09-03 NOTE — ED Triage Notes (Signed)
First Nurse Note:  States while at work a cloud of seasoning went up into patient's face.  C/O facial burning since exposure x 1 hour.  Arrives AAOx3.  Skin warm and dry. NAD

## 2020-09-03 NOTE — Discharge Instructions (Addendum)
Apply Vaseline to face three times a day.  You can take Tylenol for pain.  Please avoid excessive sunlight exposure over the next 2 to 3 days.

## 2020-09-04 ENCOUNTER — Other Ambulatory Visit: Payer: Self-pay

## 2020-09-04 ENCOUNTER — Encounter: Payer: Self-pay | Admitting: Emergency Medicine

## 2020-09-04 ENCOUNTER — Emergency Department
Admission: EM | Admit: 2020-09-04 | Discharge: 2020-09-04 | Disposition: A | Discharge status: Home / Self Care | Payer: BC Managed Care – PPO | Attending: Emergency Medicine | Admitting: Emergency Medicine

## 2020-09-04 DIAGNOSIS — L249 Irritant contact dermatitis, unspecified cause: Secondary | ICD-10-CM | POA: Insufficient documentation

## 2020-09-04 DIAGNOSIS — F17210 Nicotine dependence, cigarettes, uncomplicated: Secondary | ICD-10-CM | POA: Insufficient documentation

## 2020-09-04 NOTE — ED Triage Notes (Signed)
Presents with rash  Was seen yesterday with burning to face from having seasoning getting in face

## 2020-09-04 NOTE — ED Provider Notes (Signed)
[facility] Emergency Department Provider Note   ____________________________________________   Event Date/Time   First MD Initiated Contact with Patient 09/04/20 0930     (approximate)  I have reviewed the triage vital signs and the nursing notes.   HISTORY  Chief Complaint No chief complaint on file.    HPI Larry Lucas is a 45 y.o. male patient presents for facial rash which recurred when he went back to his job this morning.  Patient was seen in this ER yesterday and was diagnosed with irritant dermatitis affecting only to face.  Patient was given discharge care instructions and state he was doing well till he walked back into the job this morning.  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  Supervisor noticed his discomfort and saw the rash was recurrent.  Etiology is unknown is only affecting the face.  Patient is asking for consult to dermatology and medical permission to wear full face respiratory masks while at work.  States his job will accommodate wearing of the mask.  Denies dyspnea.      Past Medical History:  Diagnosis Date  . Seizures (HCC)     There are no problems to display for this patient.   History reviewed. No pertinent surgical history.  Prior to Admission medications   Medication Sig Start Date End Date Taking? Authorizing Provider  brimonidine (ALPHAGAN P) 0.1 % SOLN Place 1 drop into both eyes daily.    [provider]  carisoprodol (SOMA) 350 MG tablet Take 1 tablet (350 mg total) by mouth 3 (three) times daily as needed. Patient not taking: Reported on 03/22/2020 08/14/17   Myrna Blazer, MD  dicyclomine (BENTYL) 20 MG tablet Take 1 tablet (20 mg total) by mouth 3 (three) times daily as needed (abdominal pain). Patient not taking: Reported on 03/22/2020 01/21/17   Phineas Semen, MD  furosemide (LASIX) 40 MG tablet Take 40 mg by mouth daily. 11/16/19   [provider]  hydrOXYzine (VISTARIL) 25 MG capsule Take 25 mg by mouth daily. 11/16/19   [provider]  ibuprofen (ADVIL) 400 MG tablet Take 1 tablet (400 mg total) by mouth every 6 (six) hours as needed. 03/22/20   Pricilla Loveless, MD  PHENObarbital (LUMINAL) 60 MG tablet Take 1 tablet (60 mg total) by mouth 2 (two) times daily. Patient not taking: Reported on 03/22/2020 08/07/11 09/06/11  Molpus, Jonny Ruiz, MD  phenytoin (DILANTIN) 100 MG ER capsule Take 1 capsule (100 mg total) by mouth 3 (three) times daily. Patient not taking: Reported on 03/22/2020 08/07/11 08/06/12  Molpus, Jonny Ruiz, MD  phenytoin (DILANTIN) 100 MG ER capsule Take 1 capsule (100 mg total) by mouth 3 (three) times daily. Patient not taking: Reported on 03/22/2020 08/07/11 08/06/12  Molpus, Jonny Ruiz, MD  Monte Fantasia INHUB 100-50 MCG/DOSE AEPB Inhale 1 puff into the lungs in the morning, at noon, and at bedtime. 11/22/19   [provider]    Allergies Penicillins  No family history on file.  Social History Social History   Tobacco Use  . Smoking status: Current Every Day Smoker    Packs/day: 0.50    Types: Cigarettes  . Smokeless tobacco: Never Used  Substance Use Topics  . Alcohol use: No  . Drug use: No    Review of Systems Constitutional: No fever/chills Eyes: No visual changes. ENT: No sore throat. Cardiovascular: Denies chest pain. Respiratory: Denies shortness of breath. Gastrointestinal: No abdominal pain.  No nausea, no vomiting.  No diarrhea.  No constipation. Genitourinary: Negative for dysuria. Musculoskeletal: Negative for back pain. Skin: Positive for rash. Neurological: Negative for headaches, focal weakness or numbness. Allergic/Immunilogical: Penicillin and peanuts. ____________________________________________   PHYSICAL EXAM:  VITAL SIGNS: ED Triage Vitals  Enc Vitals Group     BP 09/04/20 0936 109/72     Pulse Rate 09/04/20 0936 79     Resp 09/04/20 0936 16     Temp 09/04/20 0936 98.5  F (36.9 C)     Temp Source 09/04/20 0936 Oral     SpO2 09/04/20 0936 97 %     Weight 09/04/20 0936 240 lb (108.9 kg)     Height 09/04/20 0936 6\' 1"  (1.854 m)     Head Circumference --      Peak Flow --      Pain Score 09/04/20 0943 0     Pain Loc --      Pain Edu? --      Excl. in GC? --    Constitutional: Alert and oriented. Well appearing and in no acute distress. Eyes: Conjunctivae are normal. PERRL. EOMI. Head: Atraumatic. Nose: No congestion/rhinnorhea. Mouth/Throat: Mucous membranes are moist.  Oropharynx non-erythematous. Cardiovascular: Normal rate, regular rhythm. Grossly normal heart sounds.  Good peripheral circulation. Respiratory: Normal respiratory effort.  No retractions. Lungs CTAB. Musculoskeletal: No lower extremity tenderness nor edema.  No joint effusions. Neurologic:  Normal speech and language. No gross focal neurologic deficits are appreciated. No gait instability. Skin:  Skin is warm, dry and intact.  Mild erythema and flaky lesions.   Psychiatric: Mood and affect are normal. Speech and behavior are normal.  ____________________________________________   LABS (all labs ordered are listed, but only abnormal results are displayed)  Labs Reviewed - No data to display ____________________________________________  EKG   ____________________________________________  RADIOLOGY I, 11/04/20, personally viewed and evaluated these images (plain radiographs) as part of my medical decision making, as well as reviewing the written report by the radiologist.  ED MD interpretation:    Official radiology report(s): No results found.  ____________________________________________   PROCEDURES  Procedure(s) performed (including Critical Care):  Procedures   ____________________________________________   INITIAL IMPRESSION / ASSESSMENT AND PLAN / ED COURSE  As part of my medical decision making, I reviewed the following data within the electronic  MEDICAL RECORD NUMBER         Patient presents with irritant dermatitis limited to the facial area.  Patient advised to follow-up with dermatology for definitive evaluation and treatment.  Advised trial of full face respiratory mask.  Continue conservative treatment.      ____________________________________________   FINAL CLINICAL IMPRESSION(S) / ED DIAGNOSES  Final diagnoses:  Irritant dermatitis     ED Discharge Orders    None      *Please note:  Larry Lucas was evaluated in Emergency Department on 09/04/2020 for the symptoms described in the history of present illness. He was evaluated in the context of the global COVID-19 pandemic, which necessitated consideration that the patient might be at risk for infection with the SARS-CoV-2 virus that causes COVID-19. Institutional protocols and algorithms that pertain to the evaluation of patients at risk for COVID-19 are in a state of rapid change based on information released by regulatory bodies including the CDC and federal and state organizations. These policies and algorithms were followed during the patient's care in the ED.  Some ED evaluations and interventions may be delayed as a result of  limited staffing during and the pandemic.*   Note:  This document was prepared using Dragon voice recognition software and may include unintentional dictation errors.    Joni Reining, PA-C 09/04/20 1002    Concha Se, MD 09/04/20 1004

## 2020-09-04 NOTE — Discharge Instructions (Addendum)
Advised full face respiratory mask pending further evaluation of irritant etiology.

## 2020-09-04 NOTE — ED Provider Notes (Incomplete)
[facility] Emergency Department Provider Note  {** REMINDER - THIS NOTE IS NOT A FINAL MEDICAL RECORD UNTIL IT IS SIGNED.  UNTIL THEN, THE CONTENT BELOW MAY REFLECT INFORMATION FROM A DOCUMENTATION TEMPLATE, NOT THE ACTUAL PATIENT VISIT. **} ____________________________________________   Event Date/Time   First MD Initiated Contact with Patient 09/04/20 0930     (approximate)  I have reviewed the triage vital signs and the nursing notes.   HISTORY  Chief Complaint No chief complaint on file.  {**Delete this block, or insert here any limitations to your history or physical exam, such as chronic dementia, altered mental status, severe respiratory distress, intoxication, etc.**}  HPI Larry Lucas is a 45 y.o. male ***        {**SYMPTOM/COMPLAINT  LOCATION (describe anatomically) DURATION (when did it start) TIMING (onset and pattern) SEVERITY (0-10, mild/moderate/severe) QUALITY (description of symptoms) CONTEXT (recent surgery, new meds, activity, etc.) MODIFYINGFACTORS (what makes it better/worse) ASSOCIATEDSYMPTOMS (pertinent positives and negatives)**} Past Medical History:  Diagnosis Date  . Seizures (HCC)     There are no problems to display for this patient.   No past surgical history on file.  Prior to Admission medications   Medication Sig Start Date End Date Taking? Authorizing Provider  brimonidine (ALPHAGAN P) 0.1 % SOLN Place 1 drop into both eyes daily.    [provider]  carisoprodol (SOMA) 350 MG tablet Take 1 tablet (350 mg total) by mouth 3 (three) times daily as needed. Patient not taking: Reported on 03/22/2020 08/14/17   Myrna Blazer, MD  dicyclomine (BENTYL) 20 MG tablet Take 1 tablet (20 mg total) by mouth 3 (three) times daily as needed (abdominal pain). Patient not taking: Reported on 03/22/2020 01/21/17   Phineas Semen, MD  furosemide (LASIX) 40 MG tablet Take 40 mg by mouth daily. 11/16/19    [provider]  hydrOXYzine (VISTARIL) 25 MG capsule Take 25 mg by mouth daily. 11/16/19   [provider]  ibuprofen (ADVIL) 400 MG tablet Take 1 tablet (400 mg total) by mouth every 6 (six) hours as needed. 03/22/20   Pricilla Loveless, MD  PHENObarbital (LUMINAL) 60 MG tablet Take 1 tablet (60 mg total) by mouth 2 (two) times daily. Patient not taking: Reported on 03/22/2020 08/07/11 09/06/11  Molpus, Jonny Ruiz, MD  phenytoin (DILANTIN) 100 MG ER capsule Take 1 capsule (100 mg total) by mouth 3 (three) times daily. Patient not taking: Reported on 03/22/2020 08/07/11 08/06/12  Molpus, Jonny Ruiz, MD  phenytoin (DILANTIN) 100 MG ER capsule Take 1 capsule (100 mg total) by mouth 3 (three) times daily. Patient not taking: Reported on 03/22/2020 08/07/11 08/06/12  Molpus, Jonny Ruiz, MD  Monte Fantasia INHUB 100-50 MCG/DOSE AEPB Inhale 1 puff into the lungs in the morning, at noon, and at bedtime. 11/22/19   [provider]    Allergies Penicillins  No family history on file.  Social History Social History   Tobacco Use  . Smoking status: Current Every Day Smoker    Packs/day: 0.50    Types: Cigarettes  . Smokeless tobacco: Never Used  Substance Use Topics  . Alcohol use: No  . Drug use: No    Review of Systems {** Revise as appropriate then delete this line - Documentation of 10 systems is required  **} Constitutional: No fever/chills Eyes: No visual changes. ENT: No sore throat. Cardiovascular: Denies chest pain. Respiratory: Denies shortness of breath. Gastrointestinal: No abdominal pain.  No nausea, no vomiting.  No diarrhea.  No  constipation. Genitourinary: Negative for dysuria. Musculoskeletal: Negative for back pain. Skin: Negative for rash. Neurological: Negative for headaches, focal weakness or numbness. {**Psychiatric:  Endocrine:  Hematological/Lymphatic:  Allergic/Immunilogical: **}  ____________________________________________   PHYSICAL EXAM:  VITAL SIGNS: ED  Triage Vitals  Enc Vitals Group     BP      Pulse      Resp      Temp      Temp src      SpO2      Weight      Height      Head Circumference      Peak Flow      Pain Score      Pain Loc      Pain Edu?      Excl. in GC?    {** Revise as appropriate then delete this line - 8 systems required **} Constitutional: Alert and oriented. Well appearing and in no acute distress. Eyes: Conjunctivae are normal. PERRL. EOMI. Head: Atraumatic. Nose: No congestion/rhinnorhea. Mouth/Throat: Mucous membranes are moist.  Oropharynx non-erythematous. Neck: No stridor.  {**No cervical spine tenderness to palpation.**} {**Hematological/Lymphatic/Immunilogical: No cervical lymphadenopathy. **}Cardiovascular: Normal rate, regular rhythm. Grossly normal heart sounds.  Good peripheral circulation. Respiratory: Normal respiratory effort.  No retractions. Lungs CTAB. Gastrointestinal: Soft and nontender. No distention. No abdominal bruits. No CVA tenderness. {**Genitourinary:  **}Musculoskeletal: No lower extremity tenderness nor edema.  No joint effusions. Neurologic:  Normal speech and language. No gross focal neurologic deficits are appreciated. No gait instability. Skin:  Skin is warm, dry and intact. No rash noted. Psychiatric: Mood and affect are normal. Speech and behavior are normal.  ____________________________________________   LABS (all labs ordered are listed, but only abnormal results are displayed)  Labs Reviewed - No data to display ____________________________________________  EKG  *** ____________________________________________  RADIOLOGY I, Joni Reining, personally viewed and evaluated these images (plain radiographs) as part of my medical decision making, as well as reviewing the written report by the radiologist.  ED MD interpretation:  ***  Official radiology report(s): No results  found.  ____________________________________________   PROCEDURES  Procedure(s) performed (including Critical Care):  Procedures   ____________________________________________   INITIAL IMPRESSION / ASSESSMENT AND PLAN / ED COURSE  As part of my medical decision making, I reviewed the following data within the electronic MEDICAL RECORD NUMBER {[id number]::"Notes from prior ED visits","[place] Controlled Substance Database"}        ***      ____________________________________________   FINAL CLINICAL IMPRESSION(S) / ED DIAGNOSES  Final diagnoses:  None     ED Discharge Orders    None      *Please note:  Larry Lucas was evaluated in Emergency Department on 09/04/2020 for the symptoms described in the history of present illness. He was evaluated in the context of the global COVID-19 pandemic, which necessitated consideration that the patient might be at risk for infection with the SARS-CoV-2 virus that causes COVID-19. Institutional protocols and algorithms that pertain to the evaluation of patients at risk for COVID-19 are in a state of rapid change based on information released by regulatory bodies including the CDC and federal and state organizations. These policies and algorithms were followed during the patient's care in the ED.  Some ED evaluations and interventions may be delayed as a result of limited staffing during and the pandemic.*   Note:  This document was prepared using Dragon voice recognition software and may include unintentional dictation errors.

## 2020-12-23 ENCOUNTER — Encounter: Payer: Self-pay | Admitting: Emergency Medicine

## 2020-12-23 ENCOUNTER — Emergency Department: Payer: BC Managed Care – PPO

## 2020-12-23 ENCOUNTER — Other Ambulatory Visit: Payer: Self-pay

## 2020-12-23 ENCOUNTER — Emergency Department
Admission: EM | Admit: 2020-12-23 | Discharge: 2020-12-23 | Disposition: A | Discharge status: Home / Self Care | Payer: BC Managed Care – PPO | Attending: Student in an Organized Health Care Education/Training Program | Admitting: Student in an Organized Health Care Education/Training Program

## 2020-12-23 DIAGNOSIS — U071 COVID-19: Secondary | ICD-10-CM | POA: Diagnosis not present

## 2020-12-23 DIAGNOSIS — F17210 Nicotine dependence, cigarettes, uncomplicated: Secondary | ICD-10-CM | POA: Diagnosis not present

## 2020-12-23 DIAGNOSIS — J40 Bronchitis, not specified as acute or chronic: Secondary | ICD-10-CM

## 2020-12-23 DIAGNOSIS — R0789 Other chest pain: Secondary | ICD-10-CM | POA: Diagnosis present

## 2020-12-23 LAB — CBC
HCT: 39.6 % (ref 39.0–52.0)
Hemoglobin: 13.4 g/dL (ref 13.0–17.0)
MCH: 29.5 pg (ref 26.0–34.0)
MCHC: 33.8 g/dL (ref 30.0–36.0)
MCV: 87.2 fL (ref 80.0–100.0)
Platelets: 333 10*3/uL (ref 150–400)
RBC: 4.54 MIL/uL (ref 4.22–5.81)
RDW: 15.2 % (ref 11.5–15.5)
WBC: 4 10*3/uL (ref 4.0–10.5)
nRBC: 0 % (ref 0.0–0.2)

## 2020-12-23 LAB — BASIC METABOLIC PANEL
Anion gap: 7 (ref 5–15)
BUN: 14 mg/dL (ref 6–20)
CO2: 24 mmol/L (ref 22–32)
Calcium: 8.8 mg/dL — ABNORMAL LOW (ref 8.9–10.3)
Chloride: 104 mmol/L (ref 98–111)
Creatinine, Ser: 1.02 mg/dL (ref 0.61–1.24)
GFR, Estimated: >60 mL/min (ref >60)
Glucose, Bld: 90 mg/dL (ref 70–99)
Potassium: 3.9 mmol/L (ref 3.5–5.1)
Sodium: 135 mmol/L (ref 135–145)

## 2020-12-23 LAB — D-DIMER, QUANTITATIVE: D-Dimer, Quant: 0.56 ug/mL-FEU — ABNORMAL HIGH (ref 0.00–0.50)

## 2020-12-23 MED ORDER — ALBUTEROL SULFATE HFA 108 (90 BASE) MCG/ACT IN AERS
2.0000 | INHALATION_SPRAY | Freq: Once | RESPIRATORY_TRACT | Status: AC
  Administered 2020-12-23: 2 via RESPIRATORY_TRACT
  Filled 2020-12-23: qty 6.7

## 2020-12-23 MED ORDER — PREDNISONE 20 MG PO TABS: 60.0000 mg | ORAL_TABLET | Freq: Every day | ORAL | 0 refills | Status: AC

## 2020-12-23 MED ORDER — METHYLPREDNISOLONE SODIUM SUCC 125 MG IJ SOLR
125.0000 mg | Freq: Once | INTRAMUSCULAR | Status: AC
  Administered 2020-12-23: 125 mg via INTRAVENOUS
  Filled 2020-12-23: qty 2

## 2020-12-23 MED ORDER — SODIUM CHLORIDE 0.9 % IV BOLUS
1000.0000 mL | Freq: Once | INTRAVENOUS | Status: AC
  Administered 2020-12-23: 1000 mL via INTRAVENOUS

## 2020-12-23 MED ORDER — BENZONATATE 100 MG PO CAPS: 100.0000 mg | ORAL_CAPSULE | Freq: Three times a day (TID) | ORAL | 0 refills | Status: AC | PRN

## 2020-12-23 MED ORDER — IOHEXOL 350 MG/ML SOLN
75.0000 mL | Freq: Once | INTRAVENOUS | Status: AC | PRN
  Administered 2020-12-23: 75 mL via INTRAVENOUS
  Filled 2020-12-23: qty 75

## 2020-12-23 MED ORDER — ONDANSETRON 4 MG PO TBDP: 4.0000 mg | ORAL_TABLET | Freq: Three times a day (TID) | ORAL | 0 refills | Status: DC | PRN

## 2020-12-23 NOTE — ED Notes (Signed)
Pt in CT.

## 2020-12-23 NOTE — ED Provider Notes (Signed)
Received patient in sign out from Dr. Roxan Hockey. Briefly, pt is here with persistent cough after recent COVID infection. Lab work remarkable for + D-Dimer, so CT Angio ordered. Plan to follow-up CT, likely d/c if negative.  CT angio returned and is negative for PE. Pt is very well appearing and in NAD. Eating, walking in ED without difficulty. Will d/c with steroids for possible RAD/bronchospasm in setting of recent COVID infection, per Dr. Roxan Hockey. Pt updated, and in agreement. He feels comfortable with plan to d/c.   Shaune Pollack, MD 12/24/20 2203

## 2020-12-23 NOTE — ED Provider Notes (Signed)
[facility] Emergency Department Provider Note    Event Date/Time   First MD Initiated Contact with Patient 12/23/20 1710     (approximate)  I have reviewed the triage vital signs and the nursing notes.   HISTORY  Chief Complaint Weakness    HPI Larry Lucas is a 45 y.o. male below listed past medical history presents to the ER for evaluation of persistent COVID symptoms consistent with a diarrheal illness dehydration persistent fevers cough and congestion also having some discomfort with deep respiration.  No hypoxia.  Did receive Paxil again.  He is vaccinated and boosted.  No does have a history of bronchitis.  Past Medical History:  Diagnosis Date   Seizures (HCC)    No family history on file. No past surgical history on file. There are no problems to display for this patient.     Prior to Admission medications   Medication Sig Start Date End Date Taking? Authorizing Provider  brimonidine (ALPHAGAN P) 0.1 % SOLN Place 1 drop into both eyes daily.    [provider]  furosemide (LASIX) 40 MG tablet Take 40 mg by mouth daily. 11/16/19   [provider]  hydrOXYzine (VISTARIL) 25 MG capsule Take 25 mg by mouth daily. 11/16/19   [provider]  ibuprofen (ADVIL) 400 MG tablet Take 1 tablet (400 mg total) by mouth every 6 (six) hours as needed. 03/22/20   Pricilla Loveless, MD  WIXELA INHUB 100-50 MCG/DOSE AEPB Inhale 1 puff into the lungs in the morning, at noon, and at bedtime. 11/22/19   [provider]    Allergies Penicillins    Social History Social History   Tobacco Use   Smoking status: Every Day    Packs/day: 0.50    Types: Cigarettes   Smokeless tobacco: Never  Substance Use Topics   Alcohol use: No   Drug use: No    Review of Systems Patient denies headaches, rhinorrhea, blurry vision, numbness, shortness of breath, chest pain, edema, cough, abdominal pain, nausea, vomiting, diarrhea,  dysuria, fevers, rashes or hallucinations unless otherwise stated above in HPI. ____________________________________________   PHYSICAL EXAM:  VITAL SIGNS: Vitals:   12/23/20 1309 12/23/20 1719  BP: 104/72 135/70  Pulse: 75 66  Resp: 20 18  Temp: 98.6 F (37 C)   SpO2: 100% 100%    Constitutional: Alert and oriented.  Eyes: Conjunctivae are normal.  Head: Atraumatic. Nose: No congestion/rhinnorhea. Mouth/Throat: Mucous membranes are moist.   Neck: No stridor. Painless ROM.  Cardiovascular: Normal rate, regular rhythm. Grossly normal heart sounds.  Good peripheral circulation. Respiratory: Normal respiratory effort.  No retractions. Lungs CTAB. Gastrointestinal: Soft and nontender. No distention. No abdominal bruits. No CVA tenderness. Genitourinary:  Musculoskeletal: No lower extremity tenderness nor edema.  No joint effusions. Neurologic:  Normal speech and language. No gross focal neurologic deficits are appreciated. No facial droop Skin:  Skin is warm, dry and intact. No rash noted. Psychiatric: Mood and affect are normal. Speech and behavior are normal.  ____________________________________________   LABS (all labs ordered are listed, but only abnormal results are displayed)  Results for orders placed or performed during the hospital encounter of 12/23/20 (from the past 24 hour(s))  Basic metabolic panel     Status: Abnormal   Collection Time: 12/23/20  1:14 PM  Result Value Ref Range   Sodium 135 135 - 145 mmol/L   Potassium 3.9 3.5 - 5.1 mmol/L   Chloride 104 98 - 111 mmol/L  CO2 24 22 - 32 mmol/L   Glucose, Bld 90 70 - 99 mg/dL   BUN 14 6 - 20 mg/dL   Creatinine, Ser 3.15 0.61 - 1.24 mg/dL   Calcium 8.8 (L) 8.9 - 10.3 mg/dL   GFR, Estimated >40 >08 mL/min   Anion gap 7 5 - 15  CBC     Status: None   Collection Time: 12/23/20  1:14 PM  Result Value Ref Range   WBC 4.0 4.0 - 10.5 K/uL   RBC 4.54 4.22 - 5.81 MIL/uL   Hemoglobin 13.4 13.0 - 17.0 g/dL   HCT  67.6 19.5 - 09.3 %   MCV 87.2 80.0 - 100.0 fL   MCH 29.5 26.0 - 34.0 pg   MCHC 33.8 30.0 - 36.0 g/dL   RDW 26.7 12.4 - 58.0 %   Platelets 333 150 - 400 K/uL   nRBC 0.0 0.0 - 0.2 %  D-dimer, quantitative     Status: Abnormal   Collection Time: 12/23/20  6:00 PM  Result Value Ref Range   D-Dimer, Quant 0.56 (H) 0.00 - 0.50 ug/mL-FEU   ____________________________________________  EKG My review and personal interpretation at Time: 13:10   Indication: cough  Rate: 75  Rhythm: sinus Axis: normal Other: normal intervals, no stemi ____________________________________________  RADIOLOGY  I personally reviewed all radiographic images ordered to evaluate for the above acute complaints and reviewed radiology reports and findings.  These findings were personally discussed with the patient.  Please see medical record for radiology report.  ____________________________________________   PROCEDURES  Procedure(s) performed:  Procedures    Critical Care performed: no ____________________________________________   INITIAL IMPRESSION / ASSESSMENT AND PLAN / ED COURSE  Pertinent labs & imaging results that were available during my care of the patient were reviewed by me and considered in my medical decision making (see chart for details).   DDX: covid, pna, pe, anemia, dehydration  Larry Lucas is a 45 y.o. who presents to the ED with is as described above.  Patient nontoxic-appearing describing symptoms of persistent COVID for the past several weeks.  States he does feel like he has been wheezing off and on no recent antibiotics or steroids.  He did receive Paxil COVID did not feel any improvement.  Denies any abdominal pain at this time blood work was sent for the above differential and is reassuring.  His chest x-ray does not show any infiltrates D-dimer was sent to evaluate further stratify for PE and is elevated therefore will order CTA.  Patient be signed out to oncoming physician  pending follow-up CTA as well as IV fluids.  Anticipate patient will be appropriate for discharge home for continued outpatient follow-up.     The patient was evaluated in Emergency Department today for the symptoms described in the history of present illness. He/she was evaluated in the context of the global COVID-19 pandemic, which necessitated consideration that the patient might be at risk for infection with the SARS-CoV-2 virus that causes COVID-19. Institutional protocols and algorithms that pertain to the evaluation of patients at risk for COVID-19 are in a state of rapid change based on information released by regulatory bodies including the CDC and federal and state organizations. These policies and algorithms were followed during the patient's care in the ED.  As part of my medical decision making, I reviewed the following data within the electronic MEDICAL RECORD NUMBER Nursing notes reviewed and incorporated, Labs reviewed, notes from prior ED visits and [place] Controlled Substance Database  ____________________________________________   FINAL CLINICAL IMPRESSION(S) / ED DIAGNOSES  Final diagnoses:  Bronchitis  COVID-19      NEW MEDICATIONS STARTED DURING THIS VISIT:  New Prescriptions   No medications on file     Note:  This document was prepared using Dragon voice recognition software and may include unintentional dictation errors.    Willy Eddy, MD 12/23/20 [place]

## 2020-12-23 NOTE — ED Notes (Signed)
See triage note  Presents with weakness and nausea  States he tested positive for COVID about 3 weeks ago   States at first he did not have sx's but then today felt worse

## 2020-12-23 NOTE — ED Triage Notes (Signed)
Pt reports that he found out that he had COVID three weeks ago, his symptoms are getting worse. He states that he is still having chills, fever and weakness. He is texting on his phone during triage.

## 2021-06-17 ENCOUNTER — Ambulatory Visit: Payer: BC Managed Care – PPO | Admitting: Family Medicine

## 2021-06-17 ENCOUNTER — Other Ambulatory Visit: Payer: Self-pay

## 2021-06-17 ENCOUNTER — Encounter: Payer: Self-pay | Admitting: Family Medicine

## 2021-06-17 DIAGNOSIS — Z113 Encounter for screening for infections with a predominantly sexual mode of transmission: Secondary | ICD-10-CM

## 2021-06-17 LAB — HM HIV SCREENING LAB: HM HIV Screening: NEGATIVE

## 2021-06-17 LAB — GRAM STAIN

## 2021-06-17 NOTE — Progress Notes (Signed)
[facility] Department STI clinic/screening visit  Subjective:  Larry Lucas is a 46 y.o. male being seen today for an STI screening visit. The patient reports they do not have symptoms.    Patient has the following medical conditions:  There are no problems to display for this patient.    Chief Complaint  Patient presents with   SEXUALLY TRANSMITTED DISEASE    screening    HPI  Patient reports here for screening, partner told him she has something but will not disclose until she is out of the state.    Does the patient or their partner desires a pregnancy in the next year? No  Screening for MPX risk: Does the patient have an unexplained rash? No Is the patient MSM? No Does the patient endorse multiple sex partners or anonymous sex partners? Yes Did the patient have close or sexual contact with a person diagnosed with MPX? No Has the patient traveled outside the [place] where MPX is endemic? No Is there a high clinical suspicion for MPX-- evidenced by one of the following No  -Unlikely to be chickenpox  -Lymphadenopathy  -Rash that present in same phase of evolution on any given body part   See flowsheet for further details and programmatic requirements.    The following portions of the patient's history were reviewed and updated as appropriate: allergies, current medications, past medical history, past social history, past surgical history and problem list.  Objective:  There were no vitals filed for this visit.  Physical Exam Constitutional:      Appearance: Normal appearance.  HENT:     Head: Normocephalic.     Mouth/Throat:     Mouth: Mucous membranes are moist.     Pharynx: Oropharynx is clear. No oropharyngeal exudate.  Pulmonary:     Effort: Pulmonary effort is normal.  Chest:       Comments: 3 spots ~ the the size of a quarter, Deep ringed red boarder with rash in the middle.  Pt reports happens when he drinks orange juice.  Favors tinea  versicolor  Genitourinary:    Penis: Normal.      Testes: Normal.     Comments: No lice, nits, or pest, no lesions or odor discharge.  Denies pain or tenderness with paplation of testicles.  No lesions, ulcers or masses present.    Musculoskeletal:     Cervical back: Normal range of motion.  Lymphadenopathy:     Cervical: No cervical adenopathy.  Skin:    General: Skin is warm and dry.     Findings: No bruising, erythema, lesion or rash.  Neurological:     Mental Status: He is alert.  Psychiatric:        Mood and Affect: Mood normal.        Behavior: Behavior normal.      Assessment and Plan:  [name] is a 46 y.o. male presenting to the [facility] Department for STI screening  1. Screening examination for venereal disease Patient does not have STI symptoms Patient accepted all screenings including  gram stain,  urethral GC and bloodwork for HIV/RPR.  Patient meets criteria for HepB screening? No. Ordered? No - doe not meet criteria  Patient meets criteria for HepC screening? Yes. Ordered? No - declined Recommended condom use with all sex Discussed importance of condom use for STI prevent  Treat gram stain per standing order Discussed time line for State Lab results and that patient will be called with  positive results and encouraged patient to call if he had not heard in 2 weeks Recommended returning for continued or worsening symptoms.  - Gonococcus culture - Gram stain - Syphilis Serology, [facility] - HIV [facility] [facility]   Return for as needed.  No future appointments.  Wendi Snipes, FNP

## 2021-06-17 NOTE — Progress Notes (Signed)
Pt here for STD screening.  Gram Stain results reviewed, no treatment required per SO.  Pt given condoms.  Berdie Ogren, RN

## 2021-06-22 LAB — GONOCOCCUS CULTURE

## 2021-06-30 ENCOUNTER — Ambulatory Visit: Payer: BC Managed Care – PPO | Admitting: Podiatry

## 2021-06-30 ENCOUNTER — Other Ambulatory Visit: Payer: Self-pay

## 2021-06-30 DIAGNOSIS — Z79899 Other long term (current) drug therapy: Secondary | ICD-10-CM

## 2021-06-30 DIAGNOSIS — B351 Tinea unguium: Secondary | ICD-10-CM | POA: Diagnosis not present

## 2021-06-30 NOTE — Patient Instructions (Signed)
Terbinafine Tablets °What is this medication? °TERBINAFINE (TER bin a feen) treats fungal infections of the nails. It belongs to a group of medications called antifungals. It will not treat infections caused by bacteria or viruses. °This medicine may be used for other purposes; ask your health care provider or pharmacist if you have questions. °COMMON BRAND NAME(S): Lamisil, Terbinex °What should I tell my care team before I take this medication? °They need to know if you have any of these conditions: °Liver disease °An unusual or allergic reaction to terbinafine, other medications, foods, dyes, or preservatives °Pregnant or trying to get pregnant °Breast-feeding °How should I use this medication? °Take this medication by mouth with water. Take it as directed on the prescription label at the same time every day. You can take it with or without food. If it upsets your stomach, take it with food. Keep taking it unless your care team tells you to stop. °A special MedGuide will be given to you by the pharmacist with each prescription and refill. Be sure to read this information carefully each time. °Talk to your care team regarding the use of this medication in children. Special care may be needed. °Overdosage: If you think you have taken too much of this medicine contact a poison control center or emergency room at once. °NOTE: This medicine is only for you. Do not share this medicine with others. °What if I miss a dose? °If you miss a dose, take it as soon as you can unless it is more than 4 hours late. If it is more than 4 hours late, skip the missed dose. Take the next dose at the normal time. °What may interact with this medication? °Do not take this medication with any of the following: °Pimozide °Thioridazine °This medication may also interact with the following: °Beta blockers °Caffeine °Certain medications for mental health conditions °Cimetidine °Cyclosporine °Medications for fungal infections like fluconazole  and ketoconazole °Medications for irregular heartbeat like amiodarone, flecainide and propafenone °Rifampin °Warfarin °This list may not describe all possible interactions. Give your health care provider a list of all the medicines, herbs, non-prescription drugs, or dietary supplements you use. Also tell them if you smoke, drink alcohol, or use illegal drugs. Some items may interact with your medicine. °What should I watch for while using this medication? °Visit your care team for regular checks on your progress. You may need blood work while you are taking this medication. It may be some time before you see the benefit from this medication. °This medication may cause serious skin reactions. They can happen weeks to months after starting the medication. Contact your care team right away if you notice fevers or flu-like symptoms with a rash. The rash may be red or purple and then turn into blisters or peeling of the skin. Or, you might notice a red rash with swelling of the face, lips or lymph nodes in your neck or under your arms. °This medication can make you more sensitive to the sun. Keep out of the sun, If you cannot avoid being in the sun, wear protective clothing and sunscreen. Do not use sun lamps or tanning beds/booths. °What side effects may I notice from receiving this medication? °Side effects that you should report to your care team as soon as possible: °Allergic reactions--skin rash, itching, hives, swelling of the face, lips, tongue, or throat °Change in sense of smell °Change in taste °Infection--fever, chills, cough, or sore throat °Liver injury--right upper belly pain, loss of appetite, nausea,   light-colored stool, dark yellow or brown urine, yellowing skin or eyes, unusual weakness or fatigue °Low red blood cell level--unusual weakness or fatigue, dizziness, headache, trouble breathing °Lupus-like syndrome--joint pain, swelling, or stiffness, butterfly-shaped rash on the face, rashes that get worse  in the sun, fever, unusual weakness or fatigue °Rash, fever, and swollen lymph nodes °Redness, blistering, peeling, or loosening of the skin, including inside the mouth °Unusual bruising or bleeding °Worsening mood, feelings of depression °Side effects that usually do not require medical attention (report to your care team if they continue or are bothersome): °Diarrhea °Gas °Headache °Nausea °Stomach pain °Upset stomach °This list may not describe all possible side effects. Call your doctor for medical advice about side effects. You may report side effects to FDA at 1-800-FDA-1088. °Where should I keep my medication? °Keep out of the reach of children and pets. °Store between 20 and 25 degrees C (68 and 77 degrees F). Protect from light. Get rid of any unused medication after the expiration date. °To get rid of medications that are no longer needed or have expired: °Take the medication to a medication take-back program. Check with your pharmacy or law enforcement to find a location. °If you cannot return the medication, check the label or package insert to see if the medication should be thrown out in the garbage or flushed down the toilet. If you are not sure, ask your care team. If it is safe to put it in the trash, take the medication out of the container. Mix the medication with cat litter, dirt, coffee grounds, or other unwanted substance. Seal the mixture in a bag or container. Put it in the trash. °NOTE: This sheet is a summary. It may not cover all possible information. If you have questions about this medicine, talk to your doctor, pharmacist, or health care provider. °© 2022 [employer]vier/Gold Standard (2020-12-03 00:00:00) ° °

## 2021-07-01 LAB — CBC WITH DIFFERENTIAL/PLATELET
Absolute Monocytes: 650 cells/uL (ref 200–950)
Basophils Absolute: 21 cells/uL (ref 0–200)
Basophils Relative: 0.4 %
Eosinophils Absolute: 130 cells/uL (ref 15–500)
Eosinophils Relative: 2.5 %
HCT: 42 % (ref 38.5–50.0)
Hemoglobin: 14 g/dL (ref 13.2–17.1)
Lymphs Abs: 1908 cells/uL (ref 850–3900)
MCH: 29.8 pg (ref 27.0–33.0)
MCHC: 33.3 g/dL (ref 32.0–36.0)
MCV: 89.4 fL (ref 80.0–100.0)
MPV: 9.7 fL (ref 7.5–12.5)
Monocytes Relative: 12.5 %
Neutro Abs: 2491 cells/uL (ref 1500–7800)
Neutrophils Relative %: 47.9 %
Platelets: 282 10*3/uL (ref 140–400)
RBC: 4.7 10*6/uL (ref 4.20–5.80)
RDW: 13.1 % (ref 11.0–15.0)
Total Lymphocyte: 36.7 %
WBC: 5.2 10*3/uL (ref 3.8–10.8)

## 2021-07-01 LAB — HEPATIC FUNCTION PANEL
AG Ratio: 1.2 (calc) (ref 1.0–2.5)
ALT: 11 U/L (ref 9–46)
AST: 13 U/L (ref 10–40)
Albumin: 4.1 g/dL (ref 3.6–5.1)
Alkaline phosphatase (APISO): 58 U/L (ref 36–130)
Bilirubin, Direct: 0.1 mg/dL (ref 0.0–0.2)
Globulin: 3.3 g/dL (calc) (ref 1.9–3.7)
Indirect Bilirubin: 0.2 mg/dL (calc) (ref 0.2–1.2)
Total Bilirubin: 0.3 mg/dL (ref 0.2–1.2)
Total Protein: 7.4 g/dL (ref 6.1–8.1)

## 2021-07-01 NOTE — Progress Notes (Signed)
Subjective:   Patient ID: Larry Lucas, male   DOB: 46 y.o.   MRN: [id number]   HPI 46 year old male presents the office today for concerns of nail fungus which has been ongoing for quite some time.  Says about 15 years ago he dropped a weight on his big toenails like him back and thickened discolored.  Occasional discomfort.  He has tried over-the-counter medicines without significant improvement.  No other concerns today.   Review of Systems  All other systems reviewed and are negative.    Past Medical History:  Diagnosis Date   Seizures (HCC)     No past surgical history on file.   Current Outpatient Medications:    benzonatate (TESSALON PERLES) 100 MG capsule, Take 1 capsule (100 mg total) by mouth 3 (three) times daily as needed for cough., Disp: 30 capsule, Rfl: 0   brimonidine (ALPHAGAN P) 0.1 % SOLN, Place 1 drop into both eyes daily., Disp: , Rfl:    furosemide (LASIX) 40 MG tablet, Take 40 mg by mouth daily., Disp: , Rfl:    hydrOXYzine (VISTARIL) 25 MG capsule, Take 25 mg by mouth daily., Disp: , Rfl:    ibuprofen (ADVIL) 400 MG tablet, Take 1 tablet (400 mg total) by mouth every 6 (six) hours as needed., Disp: 30 tablet, Rfl: 0   ondansetron (ZOFRAN ODT) 4 MG disintegrating tablet, Take 1 tablet (4 mg total) by mouth every 8 (eight) hours as needed for nausea or vomiting., Disp: 20 tablet, Rfl: 0   WIXELA INHUB 100-50 MCG/DOSE AEPB, Inhale 1 puff into the lungs in the morning, at noon, and at bedtime., Disp: , Rfl:   Allergies  Allergen Reactions   Penicillins Anaphylaxis    Has patient had a PCN reaction causing immediate rash, facial/tongue/throat swelling, SOB or lightheadedness with hypotension: YES  Has patient had a PCN reaction causing severe rash involving mucus membranes or skin necrosis: No Has patient had a PCN reaction that required hospitalization: No Has patient had a PCN reaction occurring within the last 10 years: No pt has not taken PCN  If all of  the above answers are "NO", then may proceed with Cephalosporin use.    Other     RAW tomatoes peanuts         Objective:  Physical Exam  General: AAO x3, NAD  Dermatological: Nails are hypertrophic, dystrophic, brittle, discolored, elongated 10.  In particular the hallux nails are most affected.  No surrounding redness or drainage. Tenderness nails 1-5 bilaterally. No open lesions or pre-ulcerative lesions are identified today.   Vascular: Dorsalis Pedis artery and Posterior Tibial artery pedal pulses are 2/4 bilateral with immedate capillary fill time. There is no pain with calf compression, swelling, warmth, erythema.   Neruologic: Grossly intact via light touch bilateral.   Musculoskeletal: No gross boney pedal deformities bilateral. No pain, crepitus, or limitation noted with foot and ankle range of motion bilateral. Muscular strength 5/5 in all groups tested bilateral.  Gait: Unassisted, Nonantalgic.     Assessment:   Onychomycosis     Plan:  -Treatment options discussed including all alternatives, risks, and complications -Etiology of symptoms were discussed -Discussed different treatment options for nail fungus.  Discussed oral, topical treatments.  He wants to proceed oral Lamisil.  Discussed side effects.  We will check a CBC and LFT prior to starting medication.  Vivi Barrack DPM

## 2021-07-02 ENCOUNTER — Other Ambulatory Visit: Payer: Self-pay | Admitting: Podiatry

## 2021-07-02 DIAGNOSIS — Z79899 Other long term (current) drug therapy: Secondary | ICD-10-CM

## 2021-07-02 MED ORDER — TERBINAFINE HCL 250 MG PO TABS: 250.0000 mg | ORAL_TABLET | Freq: Every day | ORAL | 0 refills | Status: AC

## 2021-07-12 ENCOUNTER — Other Ambulatory Visit: Payer: Self-pay

## 2021-07-12 ENCOUNTER — Encounter: Payer: Self-pay | Admitting: Emergency Medicine

## 2021-07-12 DIAGNOSIS — K0889 Other specified disorders of teeth and supporting structures: Secondary | ICD-10-CM | POA: Diagnosis present

## 2021-07-12 DIAGNOSIS — K047 Periapical abscess without sinus: Secondary | ICD-10-CM | POA: Diagnosis not present

## 2021-07-12 NOTE — ED Triage Notes (Addendum)
Pt complain of R side dental pain, states he feels like something is draining in his mouth. Holding ice pack to R side of face. Pt states pain began today. 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 ?

## 2021-07-13 ENCOUNTER — Emergency Department
Admission: EM | Admit: 2021-07-13 | Discharge: 2021-07-13 | Disposition: A | Discharge status: Home / Self Care | Payer: BC Managed Care – PPO | Attending: Emergency Medicine | Admitting: Emergency Medicine

## 2021-07-13 DIAGNOSIS — K047 Periapical abscess without sinus: Secondary | ICD-10-CM

## 2021-07-13 MED ORDER — IBUPROFEN 800 MG PO TABS
800.0000 mg | ORAL_TABLET | Freq: Once | ORAL | Status: AC
  Administered 2021-07-13: 800 mg via ORAL
  Filled 2021-07-13: qty 1

## 2021-07-13 MED ORDER — CHLORHEXIDINE GLUCONATE 0.12 % MT SOLN: 15.0000 mL | Freq: Four times a day (QID) | OROMUCOSAL | 0 refills | Status: AC

## 2021-07-13 MED ORDER — LIDOCAINE HCL (PF) 1 % IJ SOLN
5.0000 mL | Freq: Once | INTRAMUSCULAR | Status: AC
  Administered 2021-07-13: 5 mL
  Filled 2021-07-13: qty 5

## 2021-07-13 MED ORDER — OXYCODONE-ACETAMINOPHEN 5-325 MG PO TABS
1.0000 | ORAL_TABLET | Freq: Once | ORAL | Status: AC
  Administered 2021-07-13: 1 via ORAL
  Filled 2021-07-13: qty 1

## 2021-07-13 MED ORDER — CLINDAMYCIN HCL 150 MG PO CAPS
300.0000 mg | ORAL_CAPSULE | Freq: Once | ORAL | Status: AC
  Administered 2021-07-13: 300 mg via ORAL
  Filled 2021-07-13: qty 2

## 2021-07-13 MED ORDER — CLINDAMYCIN HCL 300 MG PO CAPS: 300.0000 mg | ORAL_CAPSULE | Freq: Three times a day (TID) | ORAL | 0 refills | Status: AC

## 2021-07-13 MED ORDER — IBUPROFEN 800 MG PO TABS: 800.0000 mg | ORAL_TABLET | Freq: Three times a day (TID) | ORAL | 0 refills | Status: DC | PRN

## 2021-07-13 MED ORDER — LIDOCAINE VISCOUS HCL 2 % MT SOLN
15.0000 mL | Freq: Once | OROMUCOSAL | Status: AC
  Administered 2021-07-13: 15 mL via OROMUCOSAL
  Filled 2021-07-13: qty 15

## 2021-07-13 NOTE — ED Provider Notes (Signed)
? ?[facility] ?Provider Note ? ? ? Event Date/Time  ? First MD Initiated Contact with Patient 07/13/21 0039   ?  (approximate) ? ? ?History  ? ?Dental Pain ? ? ?HPI ? ?Larry Lucas is a 46 y.o. male who presents for evaluation of tooth pain.  Patient reports that symptoms started today.  He is complaining progressively worsening constant sharp and severe pain on the right lower molar.  Patient has not seen his dentist.  Denies any trismus or fever. he does report like something is draining from the tooth ?  ? ? ?Past Medical History:  ?Diagnosis Date  ? Seizures ([place])   ? ? ?History reviewed. No pertinent surgical history. ? ? ?Physical Exam  ? ?Triage Vital Signs: ?ED Triage Vitals  ?Enc Vitals Group  ?   BP 07/12/21 2351 137/75  ?   Pulse Rate 07/12/21 2351 95  ?   Resp 07/12/21 2351 (!) 22  ?   Temp 07/12/21 2351 98.5 ?F (36.9 ?C)  ?   Temp src --   ?   SpO2 07/12/21 2351 94 %  ?   Weight 07/12/21 2352 240 lb (108.9 kg)  ?   Height 07/12/21 2352 6\' 1"  (1.854 m)  ?   Head Circumference --   ?   Peak Flow --   ?   Pain Score 07/12/21 2352 10  ?   Pain Loc --   ?   Pain Edu? --   ?   Excl. in [place]? --   ? ? ?Most recent vital signs: ?Vitals:  ? 07/12/21 2351  ?BP: 137/75  ?Pulse: 95  ?Resp: (!) 22  ?Temp: 98.5 ?F (36.9 ?C)  ?SpO2: 94%  ? ? ? ?Constitutional: Alert and oriented. Well appearing and in no apparent distress. ?HEENT: ?     Head: Normocephalic and atraumatic.    ?     Eyes: Conjunctivae are normal. Sclera is non-icteric.  ?     Mouth/Throat: Mucous membranes are moist.  Apical abscess of the right lower molar, floor of the mouth is soft with no induration or tenderness, no trismus ?     Neck: Supple with no signs of meningismus. ?Cardiovascular: Regular rate and rhythm. ?Respiratory: Normal respiratory effort.  ?Musculoskeletal:  No edema, cyanosis, or erythema of extremities. ?Neurologic: Normal speech and language. Face is symmetric. Moving all extremities. No gross focal  neurologic deficits are appreciated. ?Skin: Skin is warm, dry and intact. No rash noted. ?Psychiatric: Mood and affect are normal. Speech and behavior are normal. ? ?ED Results / Procedures / Treatments  ? ?Labs ?(all labs ordered are listed, but only abnormal results are displayed) ?Labs Reviewed - No data to display ? ? ?EKG ? ?none ? ? ?RADIOLOGY ?none ? ? ?PROCEDURES: ? ?Critical Care performed: No ? ?Marland Kitchen.Incision and Drainage ? ?Date/Time: 07/13/2021 1:44 AM ?Performed by: Rudene Re, MD ?Authorized by: Rudene Re, MD  ? ?Consent:  ?  Consent obtained:  Verbal ?  Consent given by:  Patient ?  Risks discussed:  Bleeding, infection, incomplete drainage and pain ?  Alternatives discussed:  Alternative treatment, delayed treatment and observation ?Location:  ?  Type:  Abscess ?  Location:  Mouth ?  Mouth location:  Alveolar process ?Pre-procedure details:  ?  Skin preparation:  Antiseptic wash ?Sedation:  ?  Sedation type:  None ?Anesthesia:  ?  Anesthesia method:  Local infiltration and topical application ?  Topical anesthetic:  Lidocaine gel ?  Local anesthetic:  Lidocaine 1% w/o epi ?Procedure type:  ?  Complexity:  Complex ?Procedure details:  ?  Incision types:  Stab incision ?  Drainage:  Bloody ?  Drainage amount:  Scant ?  Wound treatment:  Wound left open ?  Packing materials:  None ?Post-procedure details:  ?  Procedure completion:  Tolerated well, no immediate complications ? ? ? ?IMPRESSION / MDM / ASSESSMENT AND PLAN / ED COURSE  ?I reviewed the triage vital signs and the nursing notes. ? ?46 y.o. male who presents for evaluation of tooth pain.  On exam patient has a periapical abscess of the right lower molar.  No signs of Ludewig's angina.  Abscess was drained per procedure note above.  Will discharge home on clindamycin, Peridex mouthwash, and follow-up with dentist in 2 days.  Discussed my standard return precautions ? ?Ddx: Cavity versus abscess versus tooth fracture ? ? ?MEDICATIONS  GIVEN IN ED: ?Medications  ?ibuprofen (ADVIL) tablet 800 mg (has no administration in time range)  ?oxyCODONE-acetaminophen (PERCOCET/ROXICET) 5-325 MG per tablet 1 tablet (has no administration in time range)  ?lidocaine (XYLOCAINE) 2 % viscous mouth solution 15 mL (15 mLs Mouth/Throat Given by Other 07/13/21 0120)  ?lidocaine (PF) (XYLOCAINE) 1 % injection 5 mL (5 mLs Infiltration Given by Other 07/13/21 0120)  ?clindamycin (CLEOCIN) capsule 300 mg (300 mg Oral Given 07/13/21 0119)  ? ? ?EMR reviewed none ? ? ? ?FINAL CLINICAL IMPRESSION(S) / ED DIAGNOSES  ? ?Final diagnoses:  ?Dental abscess  ? ? ? ?Rx / DC Orders  ? ?ED Discharge Orders   ? ?      Ordered  ?  clindamycin (CLEOCIN) 300 MG capsule  3 times daily       ? 07/13/21 0143  ?  chlorhexidine (PERIDEX) 0.12 % solution  4 times daily       ? 07/13/21 0143  ?  ibuprofen (ADVIL) 800 MG tablet  Every 8 hours PRN       ? 07/13/21 0143  ? ?  ?  ? ?  ? ? ? ?Note:  This document was prepared using Dragon voice recognition software and may include unintentional dictation errors. ? ? ?Please note:  Patient was evaluated in Emergency Department today for the symptoms described in the history of present illness. Patient was evaluated in the context of the global COVID-19 pandemic, which necessitated consideration that the patient might be at risk for infection with the SARS-CoV-2 virus that causes COVID-19. Institutional protocols and algorithms that pertain to the evaluation of patients at risk for COVID-19 are in a state of rapid change based on information released by regulatory bodies including the CDC and federal and state organizations. These policies and algorithms were followed during the patient's care in the ED.  Some ED evaluations and interventions may be delayed as a result of limited staffing during the pandemic. ? ? ? ? ?  ?Rudene Re, MD ?07/13/21 [phone] ? ?

## 2021-07-13 NOTE — ED Notes (Signed)
Discharge instructions provided to patient with script info and follow-up. Patient verbalized understanding. Patient ambulated out to the waiting room ?

## 2021-07-28 ENCOUNTER — Encounter: Payer: Self-pay | Admitting: Podiatry

## 2021-07-29 NOTE — Telephone Encounter (Signed)
I called patient to go over results, which were normal. He has been on Lamisil without side affects. He is going to get updated blood work next week to make sure everything stays normal. Verbalized understanding. No further questions or concerns.  ?

## 2021-08-13 ENCOUNTER — Other Ambulatory Visit: Payer: Self-pay | Admitting: Podiatry

## 2021-08-13 DIAGNOSIS — D702 Other drug-induced agranulocytosis: Secondary | ICD-10-CM

## 2021-08-13 LAB — CBC WITH DIFFERENTIAL/PLATELET
Absolute Monocytes: 409 cells/uL (ref 200–950)
Basophils Absolute: 20 cells/uL (ref 0–200)
Basophils Relative: 0.7 %
Eosinophils Absolute: 70 cells/uL (ref 15–500)
Eosinophils Relative: 2.4 %
HCT: 45.6 % (ref 38.5–50.0)
Hemoglobin: 15.2 g/dL (ref 13.2–17.1)
Lymphs Abs: 1160 cells/uL (ref 850–3900)
MCH: 30.3 pg (ref 27.0–33.0)
MCHC: 33.3 g/dL (ref 32.0–36.0)
MCV: 91 fL (ref 80.0–100.0)
MPV: 10.1 fL (ref 7.5–12.5)
Monocytes Relative: 14.1 %
Neutro Abs: 1241 cells/uL — ABNORMAL LOW (ref 1500–7800)
Neutrophils Relative %: 42.8 %
Platelets: 280 10*3/uL (ref 140–400)
RBC: 5.01 10*6/uL (ref 4.20–5.80)
RDW: 13.2 % (ref 11.0–15.0)
Total Lymphocyte: 40 %
WBC: 2.9 10*3/uL — ABNORMAL LOW (ref 3.8–10.8)

## 2021-08-13 LAB — HEPATIC FUNCTION PANEL
AG Ratio: 1.2 (calc) (ref 1.0–2.5)
ALT: 16 U/L (ref 9–46)
AST: 17 U/L (ref 10–40)
Albumin: 4.3 g/dL (ref 3.6–5.1)
Alkaline phosphatase (APISO): 62 U/L (ref 36–130)
Bilirubin, Direct: 0.1 mg/dL (ref 0.0–0.2)
Globulin: 3.5 g/dL (calc) (ref 1.9–3.7)
Indirect Bilirubin: 0.3 mg/dL (calc) (ref 0.2–1.2)
Total Bilirubin: 0.4 mg/dL (ref 0.2–1.2)
Total Protein: 7.8 g/dL (ref 6.1–8.1)

## 2021-08-24 LAB — CBC WITH DIFFERENTIAL/PLATELET
Absolute Monocytes: 668 cells/uL (ref 200–950)
Basophils Absolute: 11 cells/uL (ref 0–200)
Basophils Relative: 0.2 %
Eosinophils Absolute: 143 cells/uL (ref 15–500)
Eosinophils Relative: 2.7 %
HCT: 38.2 % — ABNORMAL LOW (ref 38.5–50.0)
Hemoglobin: 12.9 g/dL — ABNORMAL LOW (ref 13.2–17.1)
Lymphs Abs: 1829 cells/uL (ref 850–3900)
MCH: 30.8 pg (ref 27.0–33.0)
MCHC: 33.8 g/dL (ref 32.0–36.0)
MCV: 91.2 fL (ref 80.0–100.0)
MPV: 9.7 fL (ref 7.5–12.5)
Monocytes Relative: 12.6 %
Neutro Abs: 2650 cells/uL (ref 1500–7800)
Neutrophils Relative %: 50 %
Platelets: 296 10*3/uL (ref 140–400)
RBC: 4.19 10*6/uL — ABNORMAL LOW (ref 4.20–5.80)
RDW: 13.1 % (ref 11.0–15.0)
Total Lymphocyte: 34.5 %
WBC: 5.3 10*3/uL (ref 3.8–10.8)

## 2021-08-25 ENCOUNTER — Other Ambulatory Visit: Payer: Self-pay | Admitting: Podiatry

## 2021-08-25 ENCOUNTER — Encounter: Payer: Self-pay | Admitting: Podiatry

## 2021-08-25 DIAGNOSIS — Z79899 Other long term (current) drug therapy: Secondary | ICD-10-CM

## 2021-08-25 NOTE — Telephone Encounter (Signed)
I called the patient to go over the blood work.  His back to stable ranges.  After discussion regards to further treatment options he is attempted topical medication without any improvement in the oral Lamisil was actually helping.  Order to go back on that medication and monitoring side effects.  Blood work in 3 weeks.  Should anything change we will not go back on the oral medication at that point.  If stable we will continue. ?

## 2021-10-09 ENCOUNTER — Encounter: Payer: Self-pay | Admitting: Podiatry

## 2021-10-10 LAB — HEPATIC FUNCTION PANEL
AG Ratio: 1.2 (calc) (ref 1.0–2.5)
ALT: 12 U/L (ref 9–46)
AST: 13 U/L (ref 10–40)
Albumin: 4.2 g/dL (ref 3.6–5.1)
Alkaline phosphatase (APISO): 65 U/L (ref 36–130)
Bilirubin, Direct: 0.1 mg/dL (ref 0.0–0.2)
Globulin: 3.5 g/dL (calc) (ref 1.9–3.7)
Indirect Bilirubin: 0.4 mg/dL (calc) (ref 0.2–1.2)
Total Bilirubin: 0.5 mg/dL (ref 0.2–1.2)
Total Protein: 7.7 g/dL (ref 6.1–8.1)

## 2021-10-10 LAB — CBC WITH DIFFERENTIAL/PLATELET
Absolute Monocytes: 500 cells/uL (ref 200–950)
Basophils Absolute: 11 cells/uL (ref 0–200)
Basophils Relative: 0.3 %
Eosinophils Absolute: 61 cells/uL (ref 15–500)
Eosinophils Relative: 1.7 %
HCT: 44.9 % (ref 38.5–50.0)
Hemoglobin: 15 g/dL (ref 13.2–17.1)
Lymphs Abs: 1271 cells/uL (ref 850–3900)
MCH: 30.4 pg (ref 27.0–33.0)
MCHC: 33.4 g/dL (ref 32.0–36.0)
MCV: 90.9 fL (ref 80.0–100.0)
MPV: 9.2 fL (ref 7.5–12.5)
Monocytes Relative: 13.9 %
Neutro Abs: 1757 cells/uL (ref 1500–7800)
Neutrophils Relative %: 48.8 %
Platelets: 295 10*3/uL (ref 140–400)
RBC: 4.94 10*6/uL (ref 4.20–5.80)
RDW: 13 % (ref 11.0–15.0)
Total Lymphocyte: 35.3 %
WBC: 3.6 10*3/uL — ABNORMAL LOW (ref 3.8–10.8)

## 2021-11-12 ENCOUNTER — Ambulatory Visit: Payer: BC Managed Care – PPO

## 2021-11-19 ENCOUNTER — Emergency Department
Admission: EM | Admit: 2021-11-19 | Discharge: 2021-11-19 | Disposition: A | Discharge status: Home / Self Care | Payer: BC Managed Care – PPO | Attending: Emergency Medicine | Admitting: Emergency Medicine

## 2021-11-19 DIAGNOSIS — Z0189 Encounter for other specified special examinations: Secondary | ICD-10-CM

## 2021-11-19 DIAGNOSIS — Z0183 Encounter for blood typing: Secondary | ICD-10-CM | POA: Insufficient documentation

## 2021-11-19 LAB — CBC WITH DIFFERENTIAL/PLATELET
Abs Immature Granulocytes: 0.01 10*3/uL (ref 0.00–0.07)
Basophils Absolute: 0 10*3/uL (ref 0.0–0.1)
Basophils Relative: 0 %
Eosinophils Absolute: 0.1 10*3/uL (ref 0.0–0.5)
Eosinophils Relative: 2 %
HCT: 49.2 % (ref 39.0–52.0)
Hemoglobin: 16 g/dL (ref 13.0–17.0)
Immature Granulocytes: 0 %
Lymphocytes Relative: 47 %
Lymphs Abs: 2.1 10*3/uL (ref 0.7–4.0)
MCH: 29.5 pg (ref 26.0–34.0)
MCHC: 32.5 g/dL (ref 30.0–36.0)
MCV: 90.8 fL (ref 80.0–100.0)
Monocytes Absolute: 0.5 10*3/uL (ref 0.1–1.0)
Monocytes Relative: 10 %
Neutro Abs: 1.9 10*3/uL (ref 1.7–7.7)
Neutrophils Relative %: 41 %
Platelets: 241 10*3/uL (ref 150–400)
RBC: 5.42 MIL/uL (ref 4.22–5.81)
RDW: 14 % (ref 11.5–15.5)
WBC: 4.6 10*3/uL (ref 4.0–10.5)
nRBC: 0 % (ref 0.0–0.2)

## 2021-11-19 LAB — COMPREHENSIVE METABOLIC PANEL
ALT: 16 U/L (ref 0–44)
AST: 17 U/L (ref 15–41)
Albumin: 3.6 g/dL (ref 3.5–5.0)
Alkaline Phosphatase: 53 U/L (ref 38–126)
Anion gap: 6 (ref 5–15)
BUN: 16 mg/dL (ref 6–20)
CO2: 23 mmol/L (ref 22–32)
Calcium: 8.6 mg/dL — ABNORMAL LOW (ref 8.9–10.3)
Chloride: 111 mmol/L (ref 98–111)
Creatinine, Ser: 1.24 mg/dL (ref 0.61–1.24)
GFR, Estimated: >60 mL/min (ref >60)
Glucose, Bld: 78 mg/dL (ref 70–99)
Potassium: 3.9 mmol/L (ref 3.5–5.1)
Sodium: 140 mmol/L (ref 135–145)
Total Bilirubin: 0.3 mg/dL (ref 0.3–1.2)
Total Protein: 7 g/dL (ref 6.5–8.1)

## 2021-11-19 NOTE — ED Triage Notes (Signed)
Pt donated blood the other day and received a letter that he was hep B positive. Pt would like to have a hepatitis panel drawn. No other issues at this time.

## 2021-11-19 NOTE — ED Notes (Signed)
Pt. Donated blood a few days ago and states he received a letter that said he had hepatitis, pt. Would like hepatitis panel drawn. Denies current symptoms, n/v/d, fever or illness.

## 2021-11-19 NOTE — ED Provider Notes (Signed)
[facility] Provider Note    Event Date/Time   First MD Initiated Contact with Patient 11/19/21 2049     (approximate)   History   Chief Complaint blood test   HPI Larry Lucas is a 46 y.o. male, history of seizures, presents emergency department for blood test.  Patient states that he donated blood the other day and received a letter that he was positive for hepatitis B.  He is requesting a hepatitis panel be drawn.  He is currently asymptomatic at this time.  Denies fever/chills, abdominal pain, nausea/vomiting, chest pain, shortness of breath, skin color changes, rash/lesions, headache, vision changes, hearing change, or dizziness/lightheadedness.  History Limitations: No limitations.        Physical Exam  Triage Vital Signs: ED Triage Vitals [11/19/21 2025]  Enc Vitals Group     BP 109/65     Pulse Rate 76     Resp 17     Temp 98.4 F (36.9 C)     Temp Source Oral     SpO2 96 %     Weight      Height      Head Circumference      Peak Flow      Pain Score 0     Pain Loc      Pain Edu?      Excl. in [place]?     Most recent vital signs: Vitals:   11/19/21 2025  BP: 109/65  Pulse: 76  Resp: 17  Temp: 98.4 F (36.9 C)  SpO2: 96%    General: Awake, NAD.  Skin: Warm, dry. No rashes or lesions.  Eyes: PERRL. Conjunctivae normal.  CV: Good peripheral perfusion.  Resp: Normal effort.  Abd: Soft, non-tender. No distention.  Neuro: At baseline. No gross neurological deficits.   Focused Exam: N/A.  Physical Exam    ED Results / Procedures / Treatments  Labs (all labs ordered are listed, but only abnormal results are displayed) Labs Reviewed  COMPREHENSIVE METABOLIC PANEL - Abnormal; Notable for the following components:      Result Value   Calcium 8.6 (*)    All other components within normal limits  CBC WITH DIFFERENTIAL/PLATELET  HEPATITIS B SURFACE ANTIBODY, QUANTITATIVE  HEPATITIS B SURFACE ANTIGEN  HEPATITIS B CORE  ANTIBODY, TOTAL  HEPATITIS B CORE ANTIBODY, IGM     EKG N/A.   RADIOLOGY  ED Provider Interpretation: N/A.  No results found.  PROCEDURES:  Critical Care performed: N/A.  Procedures    MEDICATIONS ORDERED IN ED: Medications - No data to display   IMPRESSION / MDM / ASSESSMENT AND PLAN / ED COURSE  I reviewed the triage vital signs and the nursing notes.                              Differential diagnosis includes, but is not limited to, hepatitis B, liver cirrhosis, hepatitis C, hepatitis A.  Assessment/Plan Patient presented to the emergency department for blood test following a recent positive test for hepatitis B.  Currently pending hepatitis panel at this time.  CBC shows no leukocytosis or anemia.  CMP shows no evidence of transaminitis, AKI, or electrolyte abnormalities.  He is currently asymptomatic.  Low suspicion for any serious or life-threatening pathology at this time.  Advised him to follow-up with his results with his primary care provider.  No imaging indicated at this time.  He was amenable to this  plan.  We will plan to discharge.  Patient's presentation is most consistent with acute, uncomplicated illness.   Provided the patient with anticipatory guidance, return precautions, and educational material. Encouraged the patient to return to the emergency department at any time if they begin to experience any new or worsening symptoms. Patient expressed understanding and agreed with the plan.       FINAL CLINICAL IMPRESSION(S) / ED DIAGNOSES   Final diagnoses:  Encounter for blood test     Rx / DC Orders   ED Discharge Orders     None        Note:  This document was prepared using Dragon voice recognition software and may include unintentional dictation errors.   Varney Daily, [place] 11/19/21 2352    Gilles Chiquito, MD 11/19/21 [phone]

## 2021-11-19 NOTE — Discharge Instructions (Addendum)
-  Please follow-up with your regular doctor for the results of your remaining test.  -Return to the emergency department anytime if you begin to experience any new or worsening symptoms

## 2021-11-20 LAB — HEPATITIS B SURFACE ANTIGEN: Hepatitis B Surface Ag: NONREACTIVE

## 2021-11-20 LAB — HEPATITIS B CORE ANTIBODY, TOTAL: Hep B Core Total Ab: NONREACTIVE

## 2021-11-20 LAB — HEPATITIS B CORE ANTIBODY, IGM: Hep B C IgM: NONREACTIVE

## 2021-11-21 LAB — HEPATITIS B SURFACE ANTIBODY, QUANTITATIVE: Hep B S AB Quant (Post): <3.1 m[IU]/mL — ABNORMAL LOW (ref >9.9)

## 2023-01-30 DIAGNOSIS — E669 Obesity, unspecified: Secondary | ICD-10-CM | POA: Diagnosis not present

## 2023-01-30 DIAGNOSIS — Z87891 Personal history of nicotine dependence: Secondary | ICD-10-CM | POA: Diagnosis not present

## 2023-01-30 DIAGNOSIS — Z6833 Body mass index (BMI) 33.0-33.9, adult: Secondary | ICD-10-CM | POA: Diagnosis not present

## 2023-01-30 DIAGNOSIS — Z880 Allergy status to penicillin: Secondary | ICD-10-CM | POA: Diagnosis not present

## 2023-05-10 ENCOUNTER — Emergency Department (HOSPITAL_COMMUNITY)
Admission: EM | Admit: 2023-05-10 | Discharge: 2023-05-10 | Discharge status: Against Medical Advice | Payer: Self-pay | Attending: Emergency Medicine | Admitting: Emergency Medicine

## 2023-05-10 ENCOUNTER — Other Ambulatory Visit: Payer: Self-pay

## 2023-05-10 ENCOUNTER — Encounter (HOSPITAL_COMMUNITY): Payer: Self-pay | Admitting: *Deleted

## 2023-05-10 DIAGNOSIS — R55 Syncope and collapse: Secondary | ICD-10-CM | POA: Diagnosis present

## 2023-05-10 DIAGNOSIS — I499 Cardiac arrhythmia, unspecified: Secondary | ICD-10-CM | POA: Diagnosis not present

## 2023-05-10 DIAGNOSIS — R61 Generalized hyperhidrosis: Secondary | ICD-10-CM | POA: Diagnosis not present

## 2023-05-10 DIAGNOSIS — I959 Hypotension, unspecified: Secondary | ICD-10-CM | POA: Diagnosis not present

## 2023-05-10 DIAGNOSIS — Z5321 Procedure and treatment not carried out due to patient leaving prior to being seen by health care provider: Secondary | ICD-10-CM | POA: Diagnosis not present

## 2023-05-10 DIAGNOSIS — R531 Weakness: Secondary | ICD-10-CM | POA: Diagnosis not present

## 2023-05-10 LAB — BASIC METABOLIC PANEL
Anion gap: 8 (ref 5–15)
BUN: 16 mg/dL (ref 6–20)
CO2: 23 mmol/L (ref 22–32)
Calcium: 8.9 mg/dL (ref 8.9–10.3)
Chloride: 106 mmol/L (ref 98–111)
Creatinine, Ser: 1.13 mg/dL (ref 0.61–1.24)
GFR, Estimated: >60 mL/min (ref >60)
Glucose, Bld: 75 mg/dL (ref 70–99)
Potassium: 4.5 mmol/L (ref 3.5–5.1)
Sodium: 137 mmol/L (ref 135–145)

## 2023-05-10 LAB — CBG MONITORING, ED: Glucose-Capillary: 77 mg/dL (ref 70–99)

## 2023-05-10 NOTE — ED Provider Triage Note (Signed)
 Emergency Medicine Provider Triage Evaluation Note  Larry Lucas , a 48 y.o. male with history of seizures was evaluated in triage.  Pt complains of syncopal episode.  Patient reports he has been congested recently and is working out when he passed out at work.  Review of Systems  Positive:  Negative:  Physical Exam  BP 118/74 (BP Location: Left Arm)   Pulse 86   Temp 98 F (36.7 C) (Oral)   Resp 18   SpO2 98%  Gen:   Awake, no distress   Resp:  Normal effort  MSK:   Moves extremities without difficulty  Other:  Neuro without focal deficit, ambulates without difficulty  Medical Decision Making  Medically screening exam initiated at 2:08 PM.  Appropriate orders placed.  Larry Lucas was informed that the remainder of the evaluation will be completed by another provider, this initial triage assessment does not replace that evaluation, and the importance of remaining in the ED until their evaluation is complete.     Larry Lynwood DEL, PA-C 05/10/23 1439

## 2023-05-10 NOTE — ED Triage Notes (Addendum)
 Per ems had a syncopal episode while working out. He was laying on the floor doing dumbbell presses. Pt has a seizure disorder takes Dilantin , no seizure in years. CBG 136, bp 120/70.  Pt is alert and states that he has probably missed a couple of doses of dilantin  due to not feeling well.

## 2023-05-10 NOTE — ED Notes (Signed)
 Patient decided he did not want to stay and left.

## 2023-05-11 ENCOUNTER — Emergency Department (HOSPITAL_COMMUNITY)
Admission: EM | Admit: 2023-05-11 | Discharge: 2023-05-11 | Disposition: A | Discharge status: Home / Self Care | Payer: Self-pay | Attending: Emergency Medicine | Admitting: Emergency Medicine

## 2023-05-11 DIAGNOSIS — Z0279 Encounter for issue of other medical certificate: Secondary | ICD-10-CM | POA: Insufficient documentation

## 2023-05-11 DIAGNOSIS — Z91010 Allergy to peanuts: Secondary | ICD-10-CM | POA: Diagnosis not present

## 2023-05-11 DIAGNOSIS — Z7689 Persons encountering health services in other specified circumstances: Secondary | ICD-10-CM

## 2023-05-11 NOTE — ED Provider Notes (Signed)
 [place] EMERGENCY DEPARTMENT AT [facility] Provider Note   CSN: [id number] Arrival date & time: 05/11/23  9178     History  Chief Complaint  Patient presents with   Letter for School/Work    Larry Lucas is a 48 y.o. male.  Patient is a 48 year old male with a past medical history of seizure disorder presenting to the emergency department requesting a note to return to work.  He was seen in the ED yesterday after a syncopal episode.  States that he was at the gym working out when he became lightheaded and dizzy and passed out.  He denies any associated chest pain or shortness of breath.  He states that he did not eat or drink anything yesterday before going to the gym which she thinks may have caused him to pass out.  Has had no recurrent syncopal episodes since then.  Denies any cardiac history, no family history of early cardiac disease or sudden unexplained death.  Denies any recent bleeding, black or bloody stools.   The history is provided by the patient.       Home Medications Prior to Admission medications   Medication Sig Start Date End Date Taking? Authorizing Provider  brimonidine (ALPHAGAN P) 0.1 % SOLN Place 1 drop into both eyes daily.    [provider]  furosemide (LASIX) 40 MG tablet Take 40 mg by mouth daily. 11/16/19   [provider]  hydrOXYzine (VISTARIL) 25 MG capsule Take 25 mg by mouth daily. 11/16/19   [provider]  ibuprofen  (ADVIL ) 800 MG tablet Take 1 tablet (800 mg total) by mouth every 8 (eight) hours as needed. 07/13/21   Edelmiro Leash, MD  ondansetron  (ZOFRAN  ODT) 4 MG disintegrating tablet Take 1 tablet (4 mg total) by mouth every 8 (eight) hours as needed for nausea or vomiting. 12/23/20   Angelena Smalls, MD  terbinafine  (LAMISIL ) 250 MG tablet Take 1 tablet (250 mg total) by mouth daily. 07/02/21   Gershon Donnice SAUNDERS, DPM  WIXELA INHUB 100-50 MCG/DOSE AEPB Inhale 1 puff into the lungs in the morning, at  noon, and at bedtime. 11/22/19   [provider]      Allergies    Peanut-containing drug products, Penicillins, Tomato, and Other    Review of Systems   Review of Systems  Physical Exam Updated Vital Signs BP 127/75 (BP Location: Right Arm)   Pulse 91   Temp 97.7 F (36.5 C)   Resp 15   SpO2 96%  Physical Exam Vitals and nursing note reviewed.  Constitutional:      General: He is not in acute distress.    Appearance: Normal appearance.  HENT:     Head: Normocephalic and atraumatic.     Nose: Nose normal.     Mouth/Throat:     Mouth: Mucous membranes are moist.     Pharynx: Oropharynx is clear.  Eyes:     Extraocular Movements: Extraocular movements intact.     Conjunctiva/sclera: Conjunctivae normal.  Cardiovascular:     Rate and Rhythm: Normal rate and regular rhythm.     Heart sounds: Normal heart sounds.  Pulmonary:     Effort: Pulmonary effort is normal.     Breath sounds: Normal breath sounds.  Abdominal:     General: Abdomen is flat.  Musculoskeletal:        General: Normal range of motion.     Cervical back: Normal range of motion.  Skin:    General: Skin  is warm and dry.  Neurological:     General: No focal deficit present.     Mental Status: He is alert and oriented to person, place, and time.  Psychiatric:        Mood and Affect: Mood normal.        Behavior: Behavior normal.     ED Results / Procedures / Treatments   Labs (all labs ordered are listed, but only abnormal results are displayed) Labs Reviewed - No data to display  EKG None  Radiology No results found.  Procedures Procedures    Medications Ordered in ED Medications - No data to display  ED Course/ Medical Decision Making/ A&P                                 Medical Decision Making This patient presents to the ED with chief complaint(s) of syncope, return to work note with pertinent past medical history of seizure disorder which further complicates the  presenting complaint. The complaint involves an extensive differential diagnosis and also carries with it a high risk of complications and morbidity.    The differential diagnosis includes arrhythmia, anemia, dehydration, electrolyte abnormality, no reported seizure like episode and patient remembers incident making breakthrough seizure unlikely, vasovagal, orthostatic hypotension  Additional history obtained: Additional history obtained from N/A Records reviewed Primary Care Documents and recent ED records  ED Course and Reassessment: On patient's arrival he is hemodynamically stable in no acute distress.  I reviewed patient's EKG and labs from yesterday.  EKG was normal sinus rhythm without acute ischemic changes.  Accu-Chek and BMP were normal.  He did not have CBC performed but did have recent normal CBC in November and denies any bleeding or any other symptoms of anemia.  He declined any additional lab work today and would like to return to work.  He is cleared to return to work and was recommended close primary care follow-up and was given strict return precautions.  Independent labs interpretation:  The following labs were independently interpreted: yesterday labs - within normal range  Independent visualization of imaging: - N/A  Consultation: - Consulted or discussed management/test interpretation w/ external professional: N/A  Consideration for admission or further workup: Patient has no emergent conditions requiring admission or further work-up at this time and is stable for discharge home with primary care follow-up  Social Determinants of health: N/A            Final Clinical Impression(s) / ED Diagnoses Final diagnoses:  Return to work evaluation    Rx / DC Orders ED Discharge Orders     None         Kingsley, Mareli Antunes K, [place] 05/11/23 1033

## 2023-05-11 NOTE — ED Triage Notes (Signed)
 Pt. Stated, I was here yesterday and I didn't get any discharge papers or saying I could return to work.  Pt left AMA yesterday at 1438.

## 2023-05-11 NOTE — Discharge Instructions (Addendum)
 You were seen in the emergency department today for clearance to return to work.  Your workup yesterday showed a normal EKG and normal electrolytes.  You have no signs of anemia.  Your episode of passing out yesterday definitely could be related to not eating and drinking enough before exercising however you should follow-up with your primary doctor to have your symptoms rechecked.  You are okay to return to work.  You should return to the emergency department if you have recurrent episodes of passing out, you have severe chest pain or shortness of breath before passing out or if you have any other new or concerning symptoms.

## 2023-05-20 ENCOUNTER — Telehealth: Payer: Self-pay

## 2023-05-20 NOTE — Progress Notes (Signed)
Transition Care Management Unsuccessful Follow-up Telephone Call  Date of discharge and from where:  [facility] 1/8  Attempts:  1st Attempt  Reason for unsuccessful TCM follow-up call:  No answer/busy   Lenard Forth [facility] Guide, Phone: [phone] Fax: [phone] Website: [email or web address]

## 2023-05-23 ENCOUNTER — Telehealth: Payer: Self-pay

## 2023-05-23 NOTE — Progress Notes (Signed)
Transition Care Management Unsuccessful Follow-up Telephone Call  Date of discharge and from where:  [facility]/8  Attempts:  2nd Attempt  Reason for unsuccessful TCM follow-up call:  No answer/busy   Lenard Forth [facility] Guide, Phone: [phone] Fax: [phone] Website: [email or web address]

## 2023-08-15 ENCOUNTER — Emergency Department
Admission: EM | Admit: 2023-08-15 | Discharge: 2023-08-15 | Disposition: A | Discharge status: Home / Self Care | Payer: Self-pay | Attending: Emergency Medicine | Admitting: Emergency Medicine

## 2023-08-15 ENCOUNTER — Other Ambulatory Visit: Payer: Self-pay

## 2023-08-15 DIAGNOSIS — M545 Low back pain, unspecified: Secondary | ICD-10-CM | POA: Diagnosis not present

## 2023-08-15 DIAGNOSIS — M549 Dorsalgia, unspecified: Secondary | ICD-10-CM | POA: Diagnosis present

## 2023-08-15 NOTE — ED Provider Triage Note (Signed)
 Emergency Medicine Provider Triage Evaluation Note  Larry Lucas , a 48 y.o. male  was evaluated in triage.  Pt complains of back pain that went away after drinking water. Pain started Friday morning while at work. Not having pain right now but his job wanted him to be evaluated.   Review of Systems  Positive:  Negative:  Physical Exam  BP 121/78 (BP Location: Left Arm)   Pulse 77   Temp 98.4 F (36.9 C) (Oral)   Resp 18   SpO2 98%  Gen:   Awake, no distress   Resp:  Normal effort  MSK:   Moves extremities without difficulty  Other:    Medical Decision Making  Medically screening exam initiated at 4:09 PM.  Appropriate orders placed.  Larry Lucas was informed that the remainder of the evaluation will be completed by another provider, this initial triage assessment does not replace that evaluation, and the importance of remaining in the ED until their evaluation is complete.     Phyliss Breen, PA-C 08/15/23 1611

## 2023-08-15 NOTE — ED Triage Notes (Signed)
Pt states back pain .

## 2023-08-15 NOTE — ED Provider Notes (Signed)
   [facility] Provider Note    None    (approximate)   History   Back Pain   HPI  Larry Lucas is a 48 y.o. male  with PMH of seizures who presents for evaluation of back pain. Patient states it began on Friday morning. Patient rested at home and drank lots of water. States he feels fine now. Just needs a note clearing him to return to work.       Physical Exam   Triage Vital Signs: ED Triage Vitals [08/15/23 1606]  Encounter Vitals Group     BP 121/78     Systolic BP Percentile      Diastolic BP Percentile      Pulse Rate 77     Resp 18     Temp 98.4 F (36.9 C)     Temp Source Oral     SpO2 98 %     Weight      Height      Head Circumference      Peak Flow      Pain Score      Pain Loc      Pain Education      Exclude from Growth Chart     Most recent vital signs: Vitals:   08/15/23 1606  BP: 121/78  Pulse: 77  Resp: 18  Temp: 98.4 F (36.9 C)  SpO2: 98%   General: Awake, no distress.  CV:  Good peripheral perfusion.  Resp:  Normal effort.  Abd:  No distention.  Other:     ED Results / Procedures / Treatments   Labs (all labs ordered are listed, but only abnormal results are displayed) Labs Reviewed - No data to display   PROCEDURES:  Critical Care performed: No  Procedures   MEDICATIONS ORDERED IN ED: Medications - No data to display   IMPRESSION / MDM / ASSESSMENT AND PLAN / ED COURSE  I reviewed the triage vital signs and the nursing notes.                             48 year old male presents for a work note. VSS and patient NAD on exam.  Differential diagnosis includes, but is not limited to, work clearance.  Patient's presentation is most consistent with acute, uncomplicated illness.  Patient was given a note clearing him to return work. Recommended tylenol and ibuprofen if the pain returns. Patient stable at discharge.     FINAL CLINICAL IMPRESSION(S) / ED DIAGNOSES   Final diagnoses:   Acute low back pain, unspecified back pain laterality, unspecified whether sciatica present     Rx / DC Orders   ED Discharge Orders     None        Note:  This document was prepared using Dragon voice recognition software and may include unintentional dictation errors.   Phyliss Breen, PA-C 08/15/23 1615    Twilla Galea, MD 08/15/23 1630

## 2023-08-15 NOTE — ED Notes (Signed)
D/C and reasons to return discussed with pt, pt verbalized understanding. NAD noted.

## 2023-08-15 NOTE — Discharge Instructions (Signed)
 You can take 650 mg of Tylenol and 600 mg of ibuprofen every 6 hours as needed for pain. You can use ice, heat, muscle creams and other topical pain relievers as well.

## 2023-11-27 ENCOUNTER — Other Ambulatory Visit: Payer: Self-pay

## 2023-11-27 ENCOUNTER — Encounter: Payer: Self-pay | Admitting: Emergency Medicine

## 2023-11-27 ENCOUNTER — Emergency Department
Admission: EM | Admit: 2023-11-27 | Discharge: 2023-11-27 | Disposition: A | Discharge status: Home / Self Care | Attending: Emergency Medicine | Admitting: Emergency Medicine

## 2023-11-27 DIAGNOSIS — J45909 Unspecified asthma, uncomplicated: Secondary | ICD-10-CM | POA: Diagnosis not present

## 2023-11-27 DIAGNOSIS — S025XXA Fracture of tooth (traumatic), initial encounter for closed fracture: Secondary | ICD-10-CM | POA: Diagnosis present

## 2023-11-27 DIAGNOSIS — X58XXXA Exposure to other specified factors, initial encounter: Secondary | ICD-10-CM | POA: Insufficient documentation

## 2023-11-27 DIAGNOSIS — K0889 Other specified disorders of teeth and supporting structures: Secondary | ICD-10-CM

## 2023-11-27 HISTORY — DX: Unspecified asthma, uncomplicated: J45.909

## 2023-11-27 MED ORDER — TRAMADOL HCL 50 MG PO TABS: 50.0000 mg | ORAL_TABLET | Freq: Four times a day (QID) | ORAL | 0 refills | Status: AC | PRN

## 2023-11-27 NOTE — ED Notes (Signed)
 See triage note  Presents with dental pain States he broke a tooth while at work  States he will be able to see dentist tomorrow  Requesting something for pain

## 2023-11-27 NOTE — Discharge Instructions (Signed)
 You may get some relief by using a temporary filling. You can try DenTemp or similar product.   See your dentist tomorrow.  Return to the ER for symptoms of concern if unable to see the dentist.

## 2023-11-27 NOTE — ED Provider Notes (Signed)
   [facility] Provider Note    Event Date/Time   First MD Initiated Contact with Patient 11/27/23 0732     (approximate)   History   Dental Pain   HPI  Larry Lucas is a 48 y.o. male  with history of seizures and asthma and as listed in EMR presents to the emergency department for treatment and evaluation after a broken tooth while at work 3 hours ago.  He was taking a bite of food when the tooth broke.  He was not having any dental pain prior to this.  He states that he will be able to see his dentist tomorrow but needs something to help with the pain today.SABRA     Physical Exam    Vitals:   11/27/23 0733 11/27/23 0739  BP:  124/75  Pulse:  80  Resp:  18  Temp: 97.8 F (36.6 C)   SpO2:  96%    General: Awake, no distress.  CV:  Good peripheral perfusion.  Resp:  Normal effort.  Abd:  No distention.  Other:  Dental fracture tooth #4   ED Results / Procedures / Treatments   Labs (all labs ordered are listed, but only abnormal results are displayed)  Labs Reviewed - No data to display   EKG  Not indicated.   RADIOLOGY  Image and radiology report reviewed and interpreted by me. Radiology report consistent with the same.  Not indicated.  PROCEDURES:  Critical Care performed: No  Procedures   MEDICATIONS ORDERED IN ED:  Medications - No data to display   IMPRESSION / MDM / ASSESSMENT AND PLAN / ED COURSE   I have reviewed the triage note and vital signs. Vital signs stable.   Differential diagnosis includes, but is not limited to, dental fracture, dental infection  Patient's presentation is most consistent with acute illness / injury with system symptoms.  48 year old male presenting to the emergency department for evaluation after breaking his tooth about 3 hours prior to arrival.  See HPI for further details.  On exam he does appear to have an acute fracture of tooth #4.  Plan will be to have him use temporary  dental filling and take tramadol  if needed for pain.  No indication for antibiotics today.  He states he is going to be able to see his dentist tomorrow.  He was advised to return to the emergency department for symptoms of change or worsen if he is unable to see his dentist as planned.      FINAL CLINICAL IMPRESSION(S) / ED DIAGNOSES   Final diagnoses:  Pain, dental     Rx / DC Orders   ED Discharge Orders          Ordered    traMADol  (ULTRAM ) 50 MG tablet  Every 6 hours PRN        11/27/23 0742             Note:  This document was prepared using Dragon voice recognition software and may include unintentional dictation errors.   Herlinda Kirk NOVAK, FNP 11/27/23 9249    Dicky Anes, MD 11/27/23 [phone]

## 2023-11-27 NOTE — ED Triage Notes (Signed)
 Pt to ED via POV, states he broke a tooth at work and needs something for pain.
